# Patient Record
Sex: Male | Born: 1985 | Race: Black or African American | Hispanic: No | Marital: Married | Smoking: Current every day smoker
Health system: Southern US, Community
[De-identification: ages and names within clinical notes are randomized; demographics above are authoritative.]

## PROBLEM LIST (undated history)

## (undated) DIAGNOSIS — F323 Major depressive disorder, single episode, severe with psychotic features: Secondary | ICD-10-CM

---

## 2013-03-26 ENCOUNTER — Emergency Department (HOSPITAL_COMMUNITY)
Admission: EM | Admit: 2013-03-26 | Discharge: 2013-03-26 | Disposition: A | Discharge status: Home / Self Care | Payer: Self-pay | Attending: Emergency Medicine | Admitting: Emergency Medicine

## 2013-03-26 ENCOUNTER — Emergency Department (HOSPITAL_COMMUNITY): Payer: Self-pay

## 2013-03-26 ENCOUNTER — Encounter (HOSPITAL_COMMUNITY): Payer: Self-pay | Admitting: Emergency Medicine

## 2013-03-26 DIAGNOSIS — F17200 Nicotine dependence, unspecified, uncomplicated: Secondary | ICD-10-CM | POA: Insufficient documentation

## 2013-03-26 DIAGNOSIS — R059 Cough, unspecified: Secondary | ICD-10-CM | POA: Insufficient documentation

## 2013-03-26 DIAGNOSIS — R05 Cough: Secondary | ICD-10-CM | POA: Insufficient documentation

## 2013-03-26 DIAGNOSIS — J069 Acute upper respiratory infection, unspecified: Secondary | ICD-10-CM | POA: Insufficient documentation

## 2013-03-26 DIAGNOSIS — J029 Acute pharyngitis, unspecified: Secondary | ICD-10-CM | POA: Insufficient documentation

## 2013-03-26 LAB — RAPID STREP SCREEN (MED CTR MEBANE ONLY): Streptococcus, Group A Screen (Direct): NEGATIVE

## 2013-03-26 NOTE — ED Notes (Signed)
Patient transported to X-ray 

## 2013-03-26 NOTE — ED Provider Notes (Signed)
History     CSN: [id number]  Arrival date & time 03/26/13  0302   First MD Initiated Contact with Patient 03/26/13 0340      Chief Complaint  Patient presents with  . Nasal Congestion     HPI Pt was seen at 0400.  Per pt, c/o gradual onset and persistence of constant sore throat, runny/stuffy nose, sinus congestion, and cough for the past 2-3 days. States he has been taking OTC mucinex without relief.  Denies fevers, no rash, no CP/SOB, no N/V/D, no abd pain.     History reviewed. No pertinent past medical history.  History reviewed. No pertinent past surgical history.    History  Substance Use Topics  . Smoking status: Current Every Day Smoker  . Smokeless tobacco: Not on file  . Alcohol Use: Yes      Review of Systems ROS: Statement: All systems negative except as marked or noted in the HPI; Constitutional: Negative for fever and +chills. ; ; Eyes: Negative for eye pain, redness and discharge. ; ; ENMT: Negative for ear pain, hoarseness, +nasal congestion, sinus pressure and sore throat. ; ; Cardiovascular: Negative for chest pain, palpitations, diaphoresis, dyspnea and peripheral edema. ; ; Respiratory: +cough. Negative for wheezing and stridor. ; ; Gastrointestinal: Negative for nausea, vomiting, diarrhea, abdominal pain, blood in stool, hematemesis, jaundice and rectal bleeding. . ; ; Genitourinary: Negative for dysuria, flank pain and hematuria. ; ; Musculoskeletal: Negative for back pain and neck pain. Negative for swelling and trauma.; ; Skin: Negative for pruritus, rash, abrasions, blisters, bruising and skin lesion.; ; Neuro: Negative for headache, lightheadedness and neck stiffness. Negative for weakness, altered level of consciousness , altered mental status, extremity weakness, paresthesias, involuntary movement, seizure and syncope.       Allergies  Review of patient's allergies indicates no known allergies.  Home Medications  No current outpatient  prescriptions on file.  BP 126/83  Pulse 120  Temp(Src) 98.9 F (37.2 C) (Oral)  Resp 20  SpO2 99%  Physical Exam 0405: Physical examination:  Nursing notes reviewed; Vital signs and O2 SAT reviewed;  Constitutional: Well developed, Well nourished, Well hydrated, In no acute distress; Head:  Normocephalic, atraumatic; Eyes: EOMI, PERRL, No scleral icterus; ENMT: TM's clear bilat. +edemetous nasal turbinates bilat with clear rhinorrhea.  Mouth and pharynx normal, Mucous membranes moist; Neck: Supple, Full range of motion, No lymphadenopathy; Cardiovascular: Regular rate and rhythm, No murmur, rub, or gallop; Respiratory: Breath sounds clear & equal bilaterally, No rales, rhonchi, wheezes.  Speaking full sentences with ease, Normal respiratory effort/excursion; Chest: Nontender, Movement normal; Abdomen: Soft, Nontender, Nondistended, Normal bowel sounds; Genitourinary: No CVA tenderness; Extremities: Pulses normal, No tenderness, No edema, No calf edema or asymmetry.; Neuro: AA&Ox3, Major CN grossly intact.  Speech clear. Climbs on and off stretcher by himself easily. Gait steady. No gross focal motor or sensory deficits in extremities.; Skin: Color normal, Warm, Dry.   ED Course  Procedures     MDM  MDM Reviewed: previous chart, nursing note and vitals Interpretation: x-ray and labs   Results for orders placed during the hospital encounter of 03/26/13  RAPID STREP SCREEN      Result Value Range   Streptococcus, Group A Screen (Direct) NEGATIVE  NEGATIVE   Dg Chest 2 View 03/26/2013  *RADIOLOGY REPORT*  Clinical Data: Cough and congestion.  CHEST - 2 VIEW  Comparison: None.  Findings: The lungs are clear.  Heart size is normal.  No pneumothorax or pleural fluid.  No focal bony abnormality.  IMPRESSION: Negative chest.   Original Report Authenticated By: Holley Dexter, M.D.     [phone]:  No acute findings on workup, will tx symptomatically at this time. Wants to go home now. Dx and  testing d/w pt.  Questions answered.  Verb understanding, agreeable to d/c home with outpt f/u.          Laray Anger, DO 03/28/13 1500

## 2013-03-26 NOTE — ED Notes (Signed)
Pt presents with left sided head congestion and a cough for the past 2 days.  Pt has productive cough described as yellow in color.  Tried Mucinex at home without relief.

## 2015-05-16 ENCOUNTER — Emergency Department (HOSPITAL_COMMUNITY): Payer: Self-pay

## 2015-05-16 ENCOUNTER — Encounter (HOSPITAL_COMMUNITY): Payer: Self-pay | Admitting: Emergency Medicine

## 2015-05-16 ENCOUNTER — Emergency Department (HOSPITAL_COMMUNITY)
Admission: EM | Admit: 2015-05-16 | Discharge: 2015-05-17 | Disposition: A | Discharge status: Other Acute Inpatient Hospital | Payer: Federal, State, Local not specified - Other | Attending: Emergency Medicine | Admitting: Emergency Medicine

## 2015-05-16 DIAGNOSIS — G479 Sleep disorder, unspecified: Secondary | ICD-10-CM | POA: Insufficient documentation

## 2015-05-16 DIAGNOSIS — R0602 Shortness of breath: Secondary | ICD-10-CM | POA: Insufficient documentation

## 2015-05-16 DIAGNOSIS — R002 Palpitations: Secondary | ICD-10-CM | POA: Insufficient documentation

## 2015-05-16 DIAGNOSIS — R440 Auditory hallucinations: Secondary | ICD-10-CM | POA: Insufficient documentation

## 2015-05-16 DIAGNOSIS — Z720 Tobacco use: Secondary | ICD-10-CM | POA: Insufficient documentation

## 2015-05-16 DIAGNOSIS — R51 Headache: Secondary | ICD-10-CM | POA: Insufficient documentation

## 2015-05-16 DIAGNOSIS — F29 Unspecified psychosis not due to a substance or known physiological condition: Secondary | ICD-10-CM | POA: Diagnosis present

## 2015-05-16 LAB — CBC
HCT: 40.2 % (ref 39.0–52.0)
Hemoglobin: 13.4 g/dL (ref 13.0–17.0)
MCH: 26.3 pg (ref 26.0–34.0)
MCHC: 33.3 g/dL (ref 30.0–36.0)
MCV: 79 fL (ref 78.0–100.0)
PLATELETS: 288 10*3/uL (ref 150–400)
RBC: 5.09 MIL/uL (ref 4.22–5.81)
RDW: 13.6 % (ref 11.5–15.5)
WBC: 9.1 10*3/uL (ref 4.0–10.5)

## 2015-05-16 LAB — I-STAT TROPONIN, ED: Troponin i, poc: 0 ng/mL (ref 0.00–0.08)

## 2015-05-16 LAB — BASIC METABOLIC PANEL
ANION GAP: 10 (ref 5–15)
BUN: 6 mg/dL (ref 6–20)
CALCIUM: 9.3 mg/dL (ref 8.9–10.3)
CHLORIDE: 103 mmol/L (ref 101–111)
CO2: 26 mmol/L (ref 22–32)
Creatinine, Ser: 1.07 mg/dL (ref 0.61–1.24)
GFR calc non Af Amer: >60 mL/min (ref >60)
Glucose, Bld: 124 mg/dL — ABNORMAL HIGH (ref 65–99)
Potassium: 3.6 mmol/L (ref 3.5–5.1)
SODIUM: 139 mmol/L (ref 135–145)

## 2015-05-16 LAB — BRAIN NATRIURETIC PEPTIDE: B Natriuretic Peptide: 6.2 pg/mL (ref 0.0–100.0)

## 2015-05-16 MED ORDER — SODIUM CHLORIDE 0.9 % IV SOLN
1000.0000 mL | Freq: Once | INTRAVENOUS | Status: AC
Start: 2015-05-16 — End: 2015-05-17
  Administered 2015-05-16: 1000 mL via INTRAVENOUS

## 2015-05-16 MED ORDER — SODIUM CHLORIDE 0.9 % IV SOLN
1000.0000 mL | INTRAVENOUS | Status: DC
  Administered 2015-05-17: 1000 mL via INTRAVENOUS

## 2015-05-16 MED ORDER — METOCLOPRAMIDE HCL 5 MG/ML IJ SOLN
10.0000 mg | Freq: Once | INTRAMUSCULAR | Status: AC
  Administered 2015-05-16: 10 mg via INTRAVENOUS
  Filled 2015-05-16: qty 2

## 2015-05-16 MED ORDER — DIPHENHYDRAMINE HCL 50 MG/ML IJ SOLN
25.0000 mg | Freq: Once | INTRAMUSCULAR | Status: AC
  Administered 2015-05-16: 25 mg via INTRAVENOUS
  Filled 2015-05-16: qty 1

## 2015-05-16 NOTE — ED Notes (Addendum)
Pt. reports intermittent central chest pain with mild SOB, palpitations  and occasional dry cough onset today , denies nausea or diaphoresis . No chest pain at arrival.

## 2015-05-16 NOTE — ED Notes (Signed)
Patient states he has had CP x1 year. Patient states, " I feel like my heart is stopping".

## 2015-05-16 NOTE — ED Provider Notes (Signed)
CSN: [id number]     Arrival date & time 05/16/15  2031 History  This chart was scribed for Dione Boozeavid Liesel Peckenpaugh, MD by Tanda RockersMargaux Venter, ED Scribe. This patient was seen in room B18C/B18C and the patient's care was started at 11:12 PM.  Chief Complaint  Patient presents with  . Chest Pain   The history is provided by the patient and the spouse. No language interpreter was used.     HPI Comments: Gregory Graham is a 29 y.o. male with no pertinent past medical history who presents to the Emergency Department complaining of intermittent, diffuse headache x 8 months, constant for the past few days. He rates the pain as a 10/10 on the pain scale. No modifying factors to the headache. He states that he came to the ED today because he could not handle the pain anymore. Wife mentions that pt does not like taking medication so he has not tried much for the headache. She notes that pt has also not been able to sleep for the last 3 days. Pt has never had difficulty falling asleep in the past. He denies feelings of depression, SI, HI. He states that he would like to hurt his wife but is not planning on killing her. Pt admits to auditory hallucinations x 1 year. He states that voices have been telling him to "slap his wife."  Wife mentions that pt has been having crying spells as well and has had one while in the ED tonight. Pt does mention feeling like he cannot get out of bed some mornings. He has never seen a psychiatrist for these symptoms. Denies positive fhx for psychiatric illness. Pt also complains of intermittent palpitations x 1 month. He notes chest pain that he describes as a pressure. The pain is exacerbated with movement. Pt also complains of difficulty "breathing through his nose." He attributes these symptoms to him smoking 1 ppd. Denies diaphoresis, nausea, vomiting, or any other symptoms. No EtOH or illicit drug use.   History reviewed. No pertinent past medical history. History reviewed. No pertinent past  surgical history. No family history on file. History  Substance Use Topics  . Smoking status: Current Every Day Smoker  . Smokeless tobacco: Not on file  . Alcohol Use: Yes    Review of Systems  Constitutional: Negative for diaphoresis.  Respiratory: Positive for shortness of breath.   Cardiovascular: Positive for chest pain and palpitations.  Gastrointestinal: Negative for nausea and vomiting.  Neurological: Positive for headaches.  Psychiatric/Behavioral: Positive for hallucinations and sleep disturbance. Negative for suicidal ideas.  All other systems reviewed and are negative.     Allergies  Review of patient's allergies indicates no known allergies.  Home Medications   Prior to Admission medications   Medication Sig Start Date End Date Taking? Authorizing Provider  guaiFENesin (ROBITUSSIN) 100 MG/5ML liquid Take 200 mg by mouth 3 (three) times daily as needed for cough.    Historical Provider, MD   Triage Vitals: BP 132/86 mmHg  Pulse 96  Temp(Src) 98.6 F (37 C) (Oral)  Resp 18  Ht 5\' 9"  (1.753 m)  Wt 148 lb (67.132 kg)  BMI 21.85 kg/m2  SpO2 100%   Physical Exam  Constitutional: He is oriented to person, place, and time. He appears well-developed and well-nourished. No distress.  HENT:  Head: Normocephalic and atraumatic.  Eyes: Conjunctivae and EOM are normal. Pupils are equal, round, and reactive to light.  Fundi are normal.   Neck: Normal range of motion. Neck supple. No  JVD present.  Cardiovascular: Normal rate, regular rhythm and normal heart sounds.   No murmur heard. Pulmonary/Chest: Effort normal and breath sounds normal. He has no wheezes. He has no rales. He exhibits no tenderness.  Abdominal: Soft. Bowel sounds are normal. He exhibits no distension and no mass. There is no tenderness.  Musculoskeletal: Normal range of motion. He exhibits no edema.  Lymphadenopathy:    He has no cervical adenopathy.  Neurological: He is alert and oriented to  person, place, and time. No cranial nerve deficit. He exhibits normal muscle tone. Coordination normal.  Skin: Skin is warm and dry.  Psychiatric:  Depressed affect. Speech is slow, thought processes appear slow.  Makes poor eye contact.  Speaks very softly.   Nursing note and vitals reviewed.   ED Course  Procedures (including critical care time)  DIAGNOSTIC STUDIES: Oxygen Saturation is 100% on RA, normal by my interpretation.    COORDINATION OF CARE: 11:22 PM-Discussed treatment plan which includes consult TTS, CBC, BMP, BNP, Troponin with pt at bedside and pt agreed to plan.   Labs Review Results for orders placed or performed during the hospital encounter of 05/16/15  CBC  Result Value Ref Range   WBC 9.1 4.0 - 10.5 K/uL   RBC 5.09 4.22 - 5.81 MIL/uL   Hemoglobin 13.4 13.0 - 17.0 g/dL   HCT 13.0 86.5 - 78.4 %   MCV 79.0 78.0 - 100.0 fL   MCH 26.3 26.0 - 34.0 pg   MCHC 33.3 30.0 - 36.0 g/dL   RDW 69.6 29.5 - 28.4 %   Platelets 288 150 - 400 K/uL  Basic metabolic panel  Result Value Ref Range   Sodium 139 135 - 145 mmol/L   Potassium 3.6 3.5 - 5.1 mmol/L   Chloride 103 101 - 111 mmol/L   CO2 26 22 - 32 mmol/L   Glucose, Bld 124 (H) 65 - 99 mg/dL   BUN 6 6 - 20 mg/dL   Creatinine, Ser 1.32 0.61 - 1.24 mg/dL   Calcium 9.3 8.9 - 44.0 mg/dL   GFR calc non Af Amer >60 >60 mL/min   GFR calc Af Amer >60 >60 mL/min   Anion gap 10 5 - 15  BNP (order ONLY if patient complains of dyspnea/SOB AND you have documented it for THIS visit)  Result Value Ref Range   B Natriuretic Peptide 6.2 0.0 - 100.0 pg/mL  Hepatic function panel  Result Value Ref Range   Total Protein 8.2 (H) 6.5 - 8.1 g/dL   Albumin 4.5 3.5 - 5.0 g/dL   AST 17 15 - 41 U/L   ALT 12 (L) 17 - 63 U/L   Alkaline Phosphatase 51 38 - 126 U/L   Total Bilirubin 0.5 0.3 - 1.2 mg/dL   Bilirubin, Direct 0.1 0.1 - 0.5 mg/dL   Indirect Bilirubin 0.4 0.3 - 0.9 mg/dL  Ethanol  Result Value Ref Range   Alcohol, Ethyl  (B) <5 <5 mg/dL  I-stat troponin, ED  (not at [facility] [place])  Result Value Ref Range   Troponin i, poc 0.00 0.00 - 0.08 ng/mL   Comment 3            Imaging Review Dg Chest 2 View  05/16/2015   CLINICAL DATA:  29 year old male with chest pain.  EXAM: CHEST  2 VIEW  COMPARISON:  Chest radiograph dated 03/26/2013  FINDINGS: The heart size and mediastinal contours are within normal limits. Both lungs are clear. The visualized skeletal structures are  unremarkable.  IMPRESSION: No active cardiopulmonary disease.   Electronically Signed   By: Elgie Collard M.D.   On: 05/16/2015 21:12     EKG Interpretation   Date/Time:  Wednesday May 16 2015 20:40:13 EDT Ventricular Rate:  121 PR Interval:  132 QRS Duration: 80 QT Interval:  318 QTC Calculation: 451 R Axis:   83 Text Interpretation:  Sinus tachycardia Right atrial enlargement  Nonspecific T wave abnormality Abnormal ECG No old tracing to compare  Confirmed by [facility]  MD, Jacorian Golaszewski ([id number]) on 05/16/2015 11:07:38 PM      MDM   Final diagnoses:  Auditory hallucinations    Patient presented with varied complaints. However, after extensive questioning, it became clear that his main problem is psychiatric. He has been having auditory hallucinations for a year and apparently has had thoughts of hurting his spouse. He states that his auditory hallucinations are telling him to hurt his spouse. He denies actual homicidal ideation and denies suicidal ideation. Insomnia I believe is related to his psychiatric problem. Headache appears to be benign and he will be given a headache cocktail of normal saline, metoclopramide, diphenhydramine. Heart palpitations appear benign. ECG is unremarkable. Cardiac monitor shows sinus rhythm and I have not seen any PVCs or PACs, but that is what his symptoms sound like. Screening labs will be obtained and consultation will be obtained with TTS.   TTS consult appreciated. Patient with command hallucinations. His mother  arrived and 1 and him to go home. However, with command hallucinations and thoughts of hurting his wife, was not felt safe. Patient and his mother were advised that if the to try to leave, he would be placed under involuntary commitment. They are agreeing to stay voluntarily. TTS is requested formal psychiatric consultation in the morning.  I personally performed the services described in this documentation, which was scribed in my presence. The recorded information has been reviewed and is accurate.       Dione Booze, MD 05/17/15 [phone]

## 2015-05-17 ENCOUNTER — Inpatient Hospital Stay (HOSPITAL_COMMUNITY)
Admission: AD | Admit: 2015-05-17 | Discharge: 2015-05-22 | DRG: 885 | Disposition: A | Discharge status: Home / Self Care | Payer: Federal, State, Local not specified - Other | Attending: Psychiatry | Admitting: Psychiatry

## 2015-05-17 ENCOUNTER — Encounter (HOSPITAL_COMMUNITY): Payer: Self-pay | Admitting: *Deleted

## 2015-05-17 DIAGNOSIS — F17200 Nicotine dependence, unspecified, uncomplicated: Secondary | ICD-10-CM | POA: Diagnosis present

## 2015-05-17 DIAGNOSIS — G4700 Insomnia, unspecified: Secondary | ICD-10-CM | POA: Diagnosis present

## 2015-05-17 DIAGNOSIS — F29 Unspecified psychosis not due to a substance or known physiological condition: Secondary | ICD-10-CM | POA: Diagnosis present

## 2015-05-17 DIAGNOSIS — F1290 Cannabis use, unspecified, uncomplicated: Secondary | ICD-10-CM | POA: Diagnosis not present

## 2015-05-17 DIAGNOSIS — R440 Auditory hallucinations: Secondary | ICD-10-CM | POA: Insufficient documentation

## 2015-05-17 DIAGNOSIS — F322 Major depressive disorder, single episode, severe without psychotic features: Secondary | ICD-10-CM | POA: Diagnosis not present

## 2015-05-17 DIAGNOSIS — Z720 Tobacco use: Secondary | ICD-10-CM | POA: Diagnosis not present

## 2015-05-17 DIAGNOSIS — F1221 Cannabis dependence, in remission: Secondary | ICD-10-CM | POA: Diagnosis present

## 2015-05-17 DIAGNOSIS — F323 Major depressive disorder, single episode, severe with psychotic features: Secondary | ICD-10-CM | POA: Diagnosis not present

## 2015-05-17 DIAGNOSIS — R45850 Homicidal ideations: Secondary | ICD-10-CM

## 2015-05-17 DIAGNOSIS — R45851 Suicidal ideations: Secondary | ICD-10-CM

## 2015-05-17 DIAGNOSIS — F17210 Nicotine dependence, cigarettes, uncomplicated: Secondary | ICD-10-CM | POA: Diagnosis present

## 2015-05-17 DIAGNOSIS — F419 Anxiety disorder, unspecified: Secondary | ICD-10-CM | POA: Diagnosis present

## 2015-05-17 HISTORY — DX: Major depressive disorder, single episode, severe with psychotic features: F32.3

## 2015-05-17 LAB — HEPATIC FUNCTION PANEL
ALBUMIN: 4.5 g/dL (ref 3.5–5.0)
ALT: 12 U/L — AB (ref 17–63)
AST: 17 U/L (ref 15–41)
Alkaline Phosphatase: 51 U/L (ref 38–126)
BILIRUBIN DIRECT: 0.1 mg/dL (ref 0.1–0.5)
BILIRUBIN INDIRECT: 0.4 mg/dL (ref 0.3–0.9)
Total Bilirubin: 0.5 mg/dL (ref 0.3–1.2)
Total Protein: 8.2 g/dL — ABNORMAL HIGH (ref 6.5–8.1)

## 2015-05-17 LAB — RAPID URINE DRUG SCREEN, HOSP PERFORMED
Amphetamines: NOT DETECTED
BARBITURATES: NOT DETECTED
BENZODIAZEPINES: NOT DETECTED
COCAINE: NOT DETECTED
OPIATES: NOT DETECTED
Tetrahydrocannabinol: NOT DETECTED

## 2015-05-17 LAB — ETHANOL

## 2015-05-17 MED ORDER — MAGNESIUM HYDROXIDE 400 MG/5ML PO SUSP: 30.0000 mL | Freq: Every day | ORAL | Status: DC | PRN

## 2015-05-17 MED ORDER — ZOLPIDEM TARTRATE 5 MG PO TABS: 5.0000 mg | ORAL_TABLET | Freq: Every evening | ORAL | Status: DC | PRN

## 2015-05-17 MED ORDER — NICOTINE 14 MG/24HR TD PT24
14.0000 mg | MEDICATED_PATCH | Freq: Every day | TRANSDERMAL | Status: DC
  Filled 2015-05-17: qty 1

## 2015-05-17 MED ORDER — ALUM & MAG HYDROXIDE-SIMETH 200-200-20 MG/5ML PO SUSP: 30.0000 mL | ORAL | Status: DC | PRN

## 2015-05-17 MED ORDER — IBUPROFEN 400 MG PO TABS: 600.0000 mg | ORAL_TABLET | Freq: Three times a day (TID) | ORAL | Status: DC | PRN

## 2015-05-17 MED ORDER — TRAZODONE HCL 50 MG PO TABS: 50.0000 mg | ORAL_TABLET | Freq: Every evening | ORAL | Status: DC | PRN

## 2015-05-17 MED ORDER — ONDANSETRON HCL 4 MG PO TABS: 4.0000 mg | ORAL_TABLET | Freq: Three times a day (TID) | ORAL | Status: DC | PRN

## 2015-05-17 MED ORDER — LORAZEPAM 1 MG PO TABS: 1.0000 mg | ORAL_TABLET | Freq: Three times a day (TID) | ORAL | Status: DC | PRN

## 2015-05-17 MED ORDER — ACETAMINOPHEN 325 MG PO TABS: 650.0000 mg | ORAL_TABLET | ORAL | Status: DC | PRN

## 2015-05-17 NOTE — ED Notes (Signed)
Lunch served

## 2015-05-17 NOTE — Tx Team (Signed)
Initial Interdisciplinary Treatment Plan   PATIENT STRESSORS: Health problems AH  Family stressors   PATIENT STRENGTHS: Capable of independent living Communication skills General fund of knowledge Supportive family/friends Work skills   PROBLEM LIST: Problem List/Patient Goals Date to be addressed Date deferred Reason deferred Estimated date of resolution  "my head, my heart" 05-17-15     "my head throbbing, lot of aches, pain" 05-17-15     "can't focus,voices repeating, over & over" 05-17-15     depression 05-17-15     anxiety 05-17-15                              DISCHARGE CRITERIA:  Improved stabilization in mood, thinking, and/or behavior Need for constant or close observation no longer present Reduction of life-threatening or endangering symptoms to within safe limits  PRELIMINARY DISCHARGE PLAN: Attend PHP/IOP Participate in family therapy Return to previous living arrangement Return to previous work or school arrangements  PATIENT/FAMIILY INVOLVEMENT: This treatment plan has been presented to and reviewed with the patient, Gregory Graham, and/or family member.  The patient and family have been given the opportunity to ask questions and make suggestions.  Mickeal NeedyJohnson, [name] N 05/17/2015, 8:57 PM

## 2015-05-17 NOTE — ED Provider Notes (Signed)
Psychiatry seen patient.  They're recommending inpatient treatment.  Patient has reported to be having hallucinations, hearing voices telling him to hurt and kill people, specifically his wife.  IVC has been filed by myself.   1. Auditory hallucinations      Toy CookeyMegan Docherty, MD 05/17/15 [phone]

## 2015-05-17 NOTE — ED Notes (Signed)
Psychiatrist at bedside

## 2015-05-17 NOTE — BH Assessment (Signed)
Explained recommendations to pt and family. Pt reported he did not really understand what he had agreed to previously and now wanted to go home. Discussed with pt the reasons he was being asked to stay, noting the need for UA to be collected and resulted, and the benefits of being seen by psychiatry later in the morning. Pt reports "I think I will be fine." Explained to pt and family that pt's sx were severe enough to warrant petition for IVC if decided he would not stay. Discussed voluntary vs. Involuntary. Pt voiced understanding and agreed to stay. Family expressed concerns about not being able to stay with pt in ED. Explained that if that was the policy it would have to be followed, but that they could call to check on pt with nurses if pt gave consent. Explained there were set hours for phone and visitation and that the nurses could inform them of that protocol. Mom and wife expressed understanding. Stressed with pt and family pt would not automatically come home tomorrow after seeing psychiatrist but that a recommendation would be given based on his medical status, and symptoms, and together with his treatment team a plan would be developed. Pt and family voiced understanding. Mother asked what kind of medications pt would be given tonight. Informed her this would be determined by the doctor and pt would be able to ask questions and share any concerns he had about medication. Instructed pt this writer would be available until 0730 if he had any additional questions or needed more clarification.    Clista BernhardtNancy Chara Marquard, [facility] Triage Specialist 05/17/2015 3:40 AM

## 2015-05-17 NOTE — BH Assessment (Addendum)
Tele Assessment Note  UDS had not resulted at the time of this assessment. Pt denies any recent use of drugs or alcohol.   Gregory Graham is an 29 y.o. male presenting to ED with complaints of headache, chest pain and trouble sleeping. During the course of his evaluation with EDP he reported mood lability and AH with command and a TTS consult was ordered.   At the time of assessment pt was calm and cooperative. He was oriented to person, place, and situation, but relied on wife with help on month and current date. Pt reports depressed and anxious mood, and displays a flat affect. Speech is logical, coherent, soft, and slow. Insight and judgment seem partial to fair. Pt denies self-Gregory, SI, or current SA. He reports hearing voices outside, nearly all the time, telling him to hurt or kill. Pt was vague about this. He denied intent to act on these voices, or intended victim. Pt did however, tell EDP that voices make comments about him hurting his wife. Pt reports he started to feel depressed about a year ago, then began to have intense intermittent headaches, followed by onset of AH. Pt reports he had been using THC since age [age] but stopped about 3-5 months ago due to increased paranoia. He reports he stopped THC prior to onset of HA and AH but then said he stopped due to headaches and paranoia. Pt reports he began drinking alcohol about two years ago, and drinks about 1 shot, maybe monthly.   Pt reports there were no specific events or stressors evident prior to his onset of depression. He reports current stressors include, "normal life stuff" and "work." pt reports his headaches and chest pain have effected his attendance at work. Wife reports pt has not been acting himself the past couple of days. She reports he is always quiet but, has been isolating, withdrawn, and even more quiet. She reports he has not been eating or sleeping, appears to have trouble concentrating, is not attentive to her when she  speaks to him, and has been smoking cigarettes very heavily.   Pt reports no past hx of mental health treatment. He reports he began to experience depressive sx about a year ago. He reports symptoms have been increasing in intensity. Pt reports feeling sad, lonely, isolating, anhedonia, loss of motivation, trouble initiating and maintaining sleep with early morning awakenings, and irritability. He denies sx of mania or hypomania. He reports infrequently he will feel happy for an hour to two. He denies self-injury or suicidal ideation, also denies hx of the same.   Pt reports has always had some problems sitting still and feeling fidgety, but reports he feels like his sx of anxiety have been getting worse as well. He reports "It is hard to sit still, I fidget." He reports he worries about life stressors, but feels the worry is out of proportion to the stressors. Pt reports infrequent panic attacks noting the last one was a month ago. Pt may be having trouble identifying panic attacks, as he came to ED with CP, and trouble breathing, which may have been related to his anxiety. Pt reports no hx of abuse or neglect. He denies experiencing any traumatic events. Denies sx of PTSD, or phobias. Pt reports mild rechecking of locks, and checking on children. He reports he has been having a great deal of trouble with concentration.   Pt denies family hx of mental health concerns, SA, or suicide. He reports he is willing to remain in  ED for observation and to be seen by psychiatry or to be admitted to inpt care if it is recommended.    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 No pertinent past medical history.  History reviewed. No pertinent past surgical history.  Family History: No family  history on file.  Social History:  reports that he has been smoking.  He does not have any smokeless tobacco history on file. He reports that he drinks alcohol. He reports that he does not use illicit drugs.  Additional Social History:  Alcohol / Drug Use Pain Medications: Denies Prescriptions: See PTA, denies any prescriptions Over the Counter: See PTA, reports recently tried children's nyguil for his headache  History of alcohol / drug use?: Yes Longest period of sobriety (when/how long): 3-5 months for THC  Negative Consequences of Use:  (denies) Withdrawal Symptoms:  (denies) Substance #1 Name of Substance 1: etoh  1 - Age of First Use: 26 1 - Amount (size/oz): 1 shot 1 - Frequency: about once a month  1 - Duration: two years 1 - Last Use / Amount: unknown Substance #2 Name of Substance 2: THC 2 - Age of First Use: [age]  2 - Amount (size/oz): uncertain  2 - Frequency: "quite often" nearly every day  2 - Duration: about 9 years  2 - Last Use / Amount: reports stopped 3-5 months ago because he was feeling paranoid  CIWA: CIWA-Ar BP: 109/70 mmHg Pulse Rate: 90 COWS:    PATIENT STRENGTHS: (choose at least two) Communication skills Supportive family/friends Work skills  Allergies: No Known Allergies  Home Medications:  (Not in a hospital admission)  OB/GYN Status:  No LMP for male patient.  General Assessment Data Location of Assessment: [facility] ED TTS Assessment: In system Is this a Tele or Face-to-Face Assessment?: Tele Assessment Is this an Initial Assessment or a Re-assessment for this encounter?: Initial Assessment Marital status: Married Is patient pregnant?: No Pregnancy Status: No Living Arrangements: Spouse/significant other, Children (wife and two children age 6, 71) Can pt return to current living arrangement?: Yes Admission Status: Voluntary Is patient capable of signing voluntary admission?: Yes Referral Source: Self/Family/Friend Insurance type: SP      Crisis Care Plan Living Arrangements: Spouse/significant other, Children (wife and two children age 35, 89) Name of Psychiatrist: none Name of Therapist: none  Education Status Is patient currently in school?: No Current Grade: NA Highest grade of school patient has completed: 12 Name of school: NA Contact person: NA  Risk to self with the past 6 months Suicidal Ideation: No Has patient been a risk to self within the past 6 months prior to admission? : No Suicidal Intent: No Has patient had any suicidal intent within the past 6 months prior to admission? : No Is patient at risk for suicide?: No Suicidal Plan?: No Has patient had any suicidal plan within the past 6 months prior to admission? : No Access to Means: No What has been your use of drugs/alcohol within the last 12 months?: Pt reports drinking a shot about once a month for the past two years. Reports he was smoking THC from age 62 or 89 until about 3-5 months ago. He reports he stopped due to increase in paranoia  Previous Attempts/Gestures: No How many times?: 0 Other Self  Gregory Risks: none Triggers for Past Attempts: None known Intentional Self Injurious Behavior: None Family Suicide History: No Recent stressful life event(s): Other (Comment) ("normal life things" work ) Persecutory voices/beliefs?: No Depression: Yes Depression Symptoms: Despondent, Insomnia, Tearfulness, Isolating, Loss of interest in usual pleasures, Feeling worthless/self pity, Feeling angry/irritable Substance abuse history and/or treatment for substance abuse?: No Suicide prevention information given to non-admitted patients: Yes  Risk to Others within the past 6 months Homicidal Ideation: Yes-Currently Present Does patient have any lifetime risk of violence toward others beyond the six months prior to admission? : No Thoughts of Gregory to Others: Yes-Currently Present Comment - Thoughts of Gregory to Others: Pt reports he hears voices outside  telling him to hurt or  kill, he was vague, but previously reported to EDP he had command AH to slap his wife Current Homicidal Intent: No Current Homicidal Plan: No Access to Homicidal Means: No Identified Victim: possibly his wife, denies named victim to this Clinical research associatewriter, but noted wife to EDP  History of Gregory to others?: No Assessment of Violence: None Noted Violent Behavior Description: none Does patient have access to weapons?: No Criminal Charges Pending?: No Does patient have a court date: No Is patient on probation?: No  Psychosis Hallucinations: Auditory, With command Delusions: None noted  Mental Status Report Appearance/Hygiene: Unremarkable Eye Contact: Good Motor Activity: Unremarkable Speech: Logical/coherent, Slow, Soft Level of Consciousness: Alert Mood: Depressed, Anxious Affect: Flat Anxiety Level: Panic Attacks Panic attack frequency: "not that often" Most recent panic attack: "about a month ago" possible pt was having one earlier in night with reported CP and trouble breathing  Thought Processes: Coherent, Relevant Judgement: Partial Orientation: Person, Place, Time, Situation Obsessive Compulsive Thoughts/Behaviors: None  Cognitive Functioning Concentration: Decreased Memory: Recent Intact, Remote Intact IQ: Average Insight: Poor Impulse Control: Fair Appetite: Poor Weight Loss: 0 (reports not eating for the past two days ) Weight Gain: 0 Sleep: Decreased Total Hours of Sleep: 4 (4-5 problems for "Awhile") Vegetative Symptoms: Decreased grooming  ADLScreening [facility] Assessment Services) Patient's cognitive ability adequate to safely complete daily activities?: Yes Patient able to express need for assistance with ADLs?: Yes Independently performs ADLs?: Yes (appropriate for developmental age)  Prior Inpatient Therapy Prior Inpatient Therapy: No Prior Therapy Dates: NA Prior Therapy Facilty/Provider(s): NA Reason for Treatment: NA  Prior Outpatient  Therapy Prior Outpatient Therapy: No Prior Therapy Dates: NA Prior Therapy Facilty/Provider(s): NA Reason for Treatment: NA Does patient have an ACCT team?: No Does patient have Intensive In-House Services?  : No Does patient have [facility] services? : No Does patient have P4CC services?: No  ADL Screening (condition at time of admission) Patient's cognitive ability adequate to safely complete daily activities?: Yes Is the patient deaf or have difficulty hearing?: No Does the patient have difficulty seeing, even when wearing glasses/contacts?: No Does the patient have difficulty concentrating, remembering, or making decisions?: Yes Patient able to express need for assistance with ADLs?: Yes Does the patient have difficulty dressing or bathing?: No Independently performs ADLs?: Yes (appropriate for developmental age) Does the patient have difficulty walking or climbing stairs?: No Weakness of Legs: None Weakness of Arms/Hands: None  Home Assistive Devices/Equipment Home Assistive Devices/Equipment: None    Abuse/Neglect Assessment (Assessment to be complete while patient is alone) Physical Abuse: Denies Verbal Abuse: Denies Sexual Abuse: Denies Exploitation of patient/patient's resources: Denies Self-Neglect: Denies Values / Beliefs Cultural Requests During Hospitalization: None Spiritual Requests During Hospitalization: None   Advance Directives (For Healthcare) Does patient have an  advance directive?: No Would patient like information on creating an advanced directive?: No - patient declined information    Additional Information 1:1 In Past 12 Months?: No CIRT Risk: No Elopement Risk: No Does patient have medical clearance?: No (labs pending )     Disposition:  Per Donell Sievert, PA pt should have an AM psych evaluation once he is medically cleared.   Spoke with Dr. Bebe Shaggy who reports pt's mother has come into the room and wants to take pt home. Family does not  appear to appreciate the serious nature of pt's sx. Per Dr. Bebe Shaggy he will initiate IVC if pt refuses to stay.   Agreed to discuss recommendations with pt and family over monitor.     Clista Bernhardt, [facility] Triage Specialist 05/17/2015 3:04 AM  Disposition Initial Assessment Completed for this Encounter: Yes  Gregory Graham 05/17/2015 2:45 AM

## 2015-05-17 NOTE — ED Notes (Signed)
Patient belonging given to wife. Patient wife requested a work note for patient since he had to stay.

## 2015-05-17 NOTE — Progress Notes (Signed)
Per [facility] CSW, patient is now under IVC and is recommended for inpatient hospitalization for stabilization.  This CSW has referred patient out to the following hospitals and await response:  [facility]  Arshia Rondon, MSW, [place]  [phone]

## 2015-05-17 NOTE — ED Notes (Signed)
[employer] called for transport 

## 2015-05-17 NOTE — ED Notes (Signed)
MD made aware patient family request patient to go homme. Patient mother, states" I rather take him home a pray for him. He is not going to hurt anybody. I know his rights you cant make him stay".

## 2015-05-17 NOTE — Progress Notes (Addendum)
[facility] to Dr. Elna BreslowEappen, bed [phone], call report at [phone]. RN Wille CelesteJanie was informed.  [place] Abtsatia Oswell Say, LCSWA Disposition staff 05/17/2015 4:45 PM

## 2015-05-17 NOTE — ED Notes (Signed)
Pt family came to visit at 12:50pm.  RN gave pt visitor hours information and brochure after pt questioned visiting hours.  Pt family states they already received information and was told they could stay longer.  Family was upset stating that pt should get to come home because the Doctor lied and said Pt stated he wanted to kill his wife.  Pt family asked about his status and when explained that he was voluntary at this time, family told pt that he can just get up and come on home.  RN explained that if the MD deems it unsafe for pt to go home at this time, pt can become involuntarily committed.  Family stated that there were ways to get things done and they will be going over our heads.  Family left in an agitated mood.  RN talked with Child psychotherapistsocial worker and she is in process of filling out IVC paperwork.

## 2015-05-17 NOTE — ED Notes (Signed)
Pt family left, [employer] escorted off the unit.
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
## 2015-05-17 NOTE — Progress Notes (Signed)
Patient ID: Gregory HessChristopher Graham, male   DOB: 1986-10-01, 29 y.o.   MRN: [id number] Client is a 29 yo male admitted involuntarily with complaints of AH, "can't focus, voices repeating over and over" "my head throbbing, lot of aches and pain" "my head, my heart" Client denies SHI, endorses depression "6" of 10 and anxiety "5" of 10. Client clearly anxious during this assessment, fidgety, biting finger nails, and restless. Client report only sleeps 4-5 hours a night. Client report irregular heart beat and occasional seasonal sinus relate issues. Client notes that he drinks 1-2 beers a month. Client oriented to unit and room. Client called wife for clothes. Staff will monitor q8815min for safety. Client is safe on the unit.

## 2015-05-17 NOTE — ED Notes (Signed)
Pt family visiting, [facility], [employer] here for transport, giving pt mom a chance to say good bye and [facility].

## 2015-05-17 NOTE — ED Notes (Signed)
MD spoke with Family. MD requested this nurse [facility] and transfer to his phone. [facility]. States will call this nurse within 10-15 minutes.

## 2015-05-17 NOTE — ED Notes (Addendum)
[employer] served IVC paperwork to Pt.  Spoke with pt about treatment plan and pt verbalized understanding.  Pt stated he was ready to go home and this RN explained IVC guidelines.
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
## 2015-05-17 NOTE — ED Notes (Addendum)
Staffing office called to request safety sitter

## 2015-05-17 NOTE — ED Notes (Signed)
Patient eating cookout brought by patient. Patient advised he was able to eat until test done.

## 2015-05-18 ENCOUNTER — Encounter (HOSPITAL_COMMUNITY): Payer: Self-pay | Admitting: Psychiatry

## 2015-05-18 DIAGNOSIS — Z720 Tobacco use: Secondary | ICD-10-CM

## 2015-05-18 DIAGNOSIS — F17200 Nicotine dependence, unspecified, uncomplicated: Secondary | ICD-10-CM | POA: Diagnosis present

## 2015-05-18 DIAGNOSIS — F29 Unspecified psychosis not due to a substance or known physiological condition: Secondary | ICD-10-CM

## 2015-05-18 DIAGNOSIS — F1290 Cannabis use, unspecified, uncomplicated: Secondary | ICD-10-CM

## 2015-05-18 DIAGNOSIS — F1221 Cannabis dependence, in remission: Secondary | ICD-10-CM | POA: Diagnosis present

## 2015-05-18 DIAGNOSIS — F419 Anxiety disorder, unspecified: Secondary | ICD-10-CM

## 2015-05-18 MED ORDER — NICOTINE POLACRILEX 2 MG MT GUM
2.0000 mg | CHEWING_GUM | OROMUCOSAL | Status: DC | PRN
  Administered 2015-05-20: 2 mg via ORAL

## 2015-05-18 MED ORDER — HYDROXYZINE HCL 25 MG PO TABS: 25.0000 mg | ORAL_TABLET | Freq: Three times a day (TID) | ORAL | Status: DC | PRN

## 2015-05-18 MED ORDER — AMITRIPTYLINE HCL 25 MG PO TABS: 25.0000 mg | ORAL_TABLET | Freq: Every evening | ORAL | Status: DC | PRN

## 2015-05-18 MED ORDER — ACETAMINOPHEN 325 MG PO TABS
650.0000 mg | ORAL_TABLET | Freq: Four times a day (QID) | ORAL | Status: DC | PRN
  Administered 2015-05-19: 650 mg via ORAL
  Filled 2015-05-18: qty 2

## 2015-05-18 NOTE — [facility] Suicide Risk Assessment (Signed)
[facility]   Nursing information obtained from:  Patient Demographic factors:  Male Current Mental Status:  NA Loss Factors:  NA Historical Factors:  NA Risk Reduction Factors:  Responsible for children under 28 years of age, Sense of responsibility to family, Employed, Living with another person, especially a relative, Positive social support Total Time spent with patient: 30 minutes Principal Problem: Psychosis Diagnosis:   Patient Active Problem List   Diagnosis Date Noted  . Psychosis [F29] 05/18/2015  . Anxiety [F41.9] 05/18/2015     Continued Clinical Symptoms:  Alcohol Use Disorder Identification Test Final Score (AUDIT): 1 The "Alcohol Use Disorders Identification Test", Guidelines for Use in Primary Care, Second Edition.  World Science writerHealth Organization [facility](WHO). Score between 0-7:  no or low risk or alcohol related problems. Score between 8-15:  moderate risk of alcohol related problems. Score between 16-19:  high risk of alcohol related problems. Score 20 or above:  warrants further diagnostic evaluation for alcohol dependence and treatment.   CLINICAL FACTORS:   ANXIETY SX UNSPECIFIED   Musculoskeletal: Strength & Muscle Tone: within normal limits Gait & Station: normal Patient leans: N/A  Psychiatric Specialty Exam: Physical Exam  Review of Systems  Psychiatric/Behavioral: The patient is nervous/anxious.   All other systems reviewed and are negative.   Blood pressure 108/75, pulse 85, temperature 98.3 F (36.8 C), temperature source Oral, resp. rate 20, height 5\' 8"  (1.727 m), weight 71.668 kg (158 lb).Body mass index is 24.03 kg/(m^2).            Please see H&P.                                              COGNITIVE FEATURES THAT CONTRIBUTE TO RISK:  Closed-mindedness, Polarized thinking and Thought constriction (tunnel vision)    SUICIDE RISK:   Mild:  Suicidal ideation of limited frequency, intensity,  duration, and specificity.  There are no identifiable plans, no associated intent, mild dysphoria and related symptoms, good self-control (both objective and subjective assessment), few other risk factors, and identifiable protective factors, including available and accessible social support.  PLAN OF CARE: Please see H&P.   Medical Decision Making:  Review of Psycho-Social Stressors (1), Review or order clinical lab tests (1), Established Problem, Worsening (2), Review of Last Therapy Session (1), Review of Medication Regimen & Side Effects (2) and Review of New Medication or Change in Dosage (2)  I certify that inpatient services furnished can reasonably be expected to improve the patient's condition.   Hildegarde Dunaway md 05/18/2015, 12:42 PM

## 2015-05-18 NOTE — [facility] Group Notes (Signed)
[facility] Note   05/18/2015 10:28 AM  Participation Quality:  Invited.  Chose to not attend.    Daryel GeraldNorth, Lorell Thibodaux B

## 2015-05-18 NOTE — Progress Notes (Signed)
Psychoeducational Group Note  Date:  05/18/2015 Time:  2303  Group Topic/Focus:  Wrap-Up Group:   The focus of this group is to help patients review their daily goal of treatment and discuss progress on daily workbooks.  Participation Level: Did Not Attend  Participation Quality:  Not Applicable  Affect:  Not Applicable  Cognitive:  Not Applicable  Insight:  Not Applicable  Engagement in Group: Not Applicable  Additional Comments:  The patient did not attend group this evening.   Nazli Penn S 05/18/2015, 11:03 PM
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
## 2015-05-18 NOTE — [facility] Counselor (Signed)
Adult Comprehensive Assessment  Patient ID: Veronica Guerrant, male   DOB: [birth year], 29 y.o.   MRN: [id number]  Information Source: Information source: Patient  Current Stressors:  Educational / Learning stressors: Pt reports that he wasnts to take more college classes Employment / Job issues: Pt is currently employed at [employer] but is unsure if he will still be employed Family Relationships: None reported Surveyor, quantity / Lack of resources (include bankruptcy): Limited income Housing / Lack of housing: None reported Physical health (include injuries & life threatening diseases): Pt reports frequent headaches Social relationships: None reported Substance abuse: Pt reports that he stopped using marijuana 3-5 months ago Bereavement / Loss: None reported  Living/Environment/Situation:  Living Arrangements: Spouse/significant other Living conditions (as described by patient or guardian): "okay" How long has patient lived in current situation?: 1 year What is atmosphere in current home: Supportive, Comfortable  Family History:  Marital status: Married What types of issues is patient dealing with in the relationship?: Pt reports that the relationship with his wife is "great" and that she is very supportive Does patient have children?: Yes How many children?: 2 How is patient's relationship with their children?: Pt reports that he has a good relationship with his children  Childhood History:  By whom was/is the patient raised?: Both parents Description of patient's relationship with caregiver when they were a child: "normal childhood" Patient's description of current relationship with people who raised him/her: "good"; Parents live outside of [place] Does patient have siblings?: Yes Number of Siblings: 3 Description of patient's current relationship with siblings: reports feeling "close" to brothers Did patient suffer any verbal/emotional/physical/sexual abuse as a child?: No Did patient  suffer from severe childhood neglect?: No Has patient ever been sexually abused/assaulted/raped as an adolescent or adult?: No Was the patient ever a victim of a crime or a disaster?: No Witnessed domestic violence?: No Has patient been effected by domestic violence as an adult?: No  Education:  Highest grade of school patient has completed: 12 Currently a Consulting civil engineer?: No Learning disability?: No  Employment/Work Situation:   Employment situation: Employed Where is patient currently employed?: [employer] How long has patient been employed?: 1 month Patient's job has been impacted by current illness: No What is the longest time patient has a held a job?: 2 years 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  Has patient ever been in the [employer]?: No Has patient ever served in Buyer, retail?: No  Financial Resources:   Financial resources: Income from employment, Income from spouse  Alcohol/Substance Abuse:   What has been your use of drugs/alcohol within the last 12 months?: Pt reports occassional use of alcohol, approximately 1x a month; stopped smoking THC 3-5 months ago If attempted suicide, did drugs/alcohol play a role in this?: No Alcohol/Substance Abuse Treatment Hx: Denies past history Has alcohol/substance abuse ever caused legal problems?: No  Social Support System:   Patient's Community Support System: Good Describe Community Support System: family Type of faith/religion: Grew up [place] "spiritual" How does patient's faith help to cope with current illness?: helps deal with stress  Leisure/Recreation:   Leisure and Hobbies: play ball, laser tag  Strengths/Needs:   What things does the patient do well?: building, being a good  In what areas does patient struggle / problems for patient: did not state  Discharge Plan:   Does patient have access to transportation?: Yes Will patient be returning to same living situation after discharge?: Yes Currently receiving  community mental health services: No If no,  would patient like referral for services when discharged?: Yes (What county?) [facility]/gallery curator([facility]) Does patient have financial barriers related to discharge medications?: Yes Patient description of barriers related to discharge medications: no insurance; limited income  Summary/Recommendations:     Patient is a 29 year old African American male who was admitted with command auditory hallucinations to harm others. During assessment, Pt was cooperative however appeared to be responding to internal stimuli at times AEB looking around the room and inappropriate affect. Pt expressed to CSW that he only "hears voices around him" but could not state if they were coming from people who were actually in the room. Pt continues to express that headaches and GI problems are the reason he came to the hospital along with an inability to sleep well. Pt lives at home with his wife and has supportive family network in the area. Pt expressed that he does not feel that he needs medications at this time but would be interested in therapy. [facility] and [facility] with his wife. Patient will benefit from crisis stabilization, medication evaluation, group therapy and psycho education in addition to case management for discharge planning.     Elaina Hoopsarter, Shaquile Lutze M. 05/18/2015

## 2015-05-18 NOTE — Progress Notes (Signed)
D Gregory DeerChristopher has spent the entire day in his room. HE refuses to admit that he has a mental issue, that he needs medication and that he attend his groups. He did complete his daily assessment and on it he wrote he denied SI and he rated his depression, hopelessness and anxiety " 0/0/0",resepctively.    A HE would not engage in any type of therapeutic conversation with this Clinical research associatewriter today...only says " I m ready to go".    R Safety is in place.

## 2015-05-18 NOTE — Tx Team (Signed)
Interdisciplinary Treatment Plan Update (Adult)  Date:  05/18/2015   Time Reviewed:  8:23 AM   Progress in Treatment: Attending groups: Yes. Participating in groups:  Minimally Taking medication as prescribed:  Yes. Tolerating medication:  Yes. Family/Significant othe contact made:  No Patient understands diagnosis: No  Denies symptoms Discussing patient identified problems/goals with staff:  Yes, see initial care plan. Medical problems stabilized or resolved:  Yes. Denies suicidal/homicidal ideation: Yes. Issues/concerns per patient self-inventory:  No. Other:  New problem(s) identified:  Discharge Plan or Barriers:  Return home, follow up outpt  Reason for Continuation of Hospitalization: Hallucinations Medication stabilization  Comments:  Patient presented with varied complaints. However, after extensive questioning, it became clear that his main problem is psychiatric. He has been having auditory hallucinations for a year and apparently has had thoughts of hurting his spouse. He states that his auditory hallucinations are telling him to hurt his spouse. He denies actual homicidal ideation and denies suicidal ideation. Insomnia I believe is related to his psychiatric problem. Headache appears to be benign and he will be given a headache cocktail.  TTS consult appreciated. Patient with command hallucinations. His mother arrived and 1 and him to go home. However, with command hallucinations and thoughts of hurting his wife, was not felt safe. Patient and his mother were advised that if the to try to leave, he would be placed under involuntary commitment. They are agreeing to stay voluntarily. Denies all symptoms today.  Have not been able to reach family yet, though he did sign a release. Pt reluctant to take meds.  Estimated length of stay: 3-5 days  New goal(s):  Review of initial/current patient goals per problem list:     Attendees: Patient:  05/18/2015 8:23 AM   Family:    05/18/2015 8:23 AM   Physician:  Jomarie LongsSaramma Eappen, MD 05/18/2015 8:23 AM   Nursing:   Liborio NixonPatrice White, RN 05/18/2015 8:23 AM   CSW:    Daryel Geraldodney Aiken Withem, LCSW   05/18/2015 8:23 AM   Other:  05/18/2015 8:23 AM   Other:   05/18/2015 8:23 AM   Other:  Onnie BoerJennifer Dillinger, Nurse CM 05/18/2015 8:23 AM   Other:  Leisa LenzValerie Enoch, [facility] 05/18/2015 8:23 AM   Other:  Tomasita Morrowelora Sutton, P4[facility]  05/18/2015 8:23 AM   Other:  05/18/2015 8:23 AM   Other:  05/18/2015 8:23 AM   Other:  05/18/2015 8:23 AM   Other:  05/18/2015 8:23 AM   Other:  05/18/2015 8:23 AM   Other:   05/18/2015 8:23 AM    Scribe for Treatment Team:   Ida RogueNorth, Oseas Detty B, 05/18/2015 8:23 AM

## 2015-05-18 NOTE — [facility] Group Notes (Signed)
[facility] LCSW Group Therapy 05/18/2015 1:15 PM  Type of Therapy: Group Therapy  Participation Level: Minimal  Participation Quality: Attentive  Affect: Depressed, guarded  Cognitive:  Oriented  Insight: Developing  Engagement in Therapy: Developing  Modes of Intervention: Clarification, Confrontation, Discussion, Education, Exploration, Limit-setting, Orientation, Problem-solving, Rapport Building, Dance movement psychotherapisteality Testing, Socialization and Support  Summary of Progress/Problems: The topic for today was feelings about relapse. Pt discussed what recovery is to them and identified triggers that they are on the path to relapse. Pt processed their feeling towards relapse and was able to relate to peers. Pt discussed coping skills that can be used for relapse prevention. Patient listened attentively, nodded head in agreement but did not actively participate in group.  Is new to unit and adjusting.    Santa GeneraAnne Meiling Hendriks, LCSW Clinical Social Worker

## 2015-05-18 NOTE — H&P (Signed)
Psychiatric Admission Assessment Adult  Patient Identification: Gregory Graham MRN:  [id number] Date of Evaluation:  05/18/2015 Chief Complaint: Patient states " I am not depressed , I may be anxious , but I do not want any medications. 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                                                            R/O Panic disorder  Diagnosis:   Patient Active Problem List   Diagnosis Date Noted  . Psychosis [F29] 05/18/2015  . Anxiety [F41.9] 05/18/2015  . Cannabis use disorder, severe, in early remission [F12.90] 05/18/2015  . Tobacco use disorder [Z72.0] 05/18/2015      History of Present Illness:: Gregory Graham is a 29 y.o. AA male, with no significant past hx of mental illness, presented to ED with complaints of headache, chest pain and trouble sleeping. During the course of his evaluation with EDP he reported mood lability and AH with command. Per initial notes in EHR -" Patient reported depressed and anxious mood, and displayed flat affect. Speech - logical, coherent, soft, and slow. Insight and judgment seem partial to fair. Pt denied self-harm, SI, or current SA. He reported hearing voices outside, nearly all the time, telling him to hurt or kill. Pt was vague about this. He denied intent to act on these voices, or intended victim. Pt did however, tell EDP that voices make comments about him hurting his wife. Pt reported that he started to feel depressed about a year ago, then began to have intense intermittent headaches, followed by onset of AH. Pt reported that he had been using THC since age 18-19 but stopped about 3-5 months ago due to increased paranoia. "  Patient seen this AM. Pt appeared to be withdrawn , pt slow , depressed affect, poor eye contact. Pt also appeared to be a very  limited historian. Pt today reports to writer that he has been having these headaches for several months - his headaches are on the frontal aspect of his head, unable to explain what type , is present atleast 3-4 times a week, relieved by pain management - ibuprofen. Pt also reports his heart as fluttering sometimes - he does report anxiety - but unable to elaborate .  Pt reports feeling tired and more sleepy recently , but denies any depression. Pt denies hopelessness, anhedonia or inability to concentrate . Pt denies SI/AH/VH at this time. Pt reports he does not remember talking about HI towards his wife in the ED and that he has never felt that way . Pt did report sleep issues in ED - but reports to writer that he sleeps too much.  Pt denied any substance to Probation officer - but did endorse cannabis use in the past as well as alcohol use occasional - while in ED.  Pt denies previous hospitalizations, denies suicide attempts.  Pt denies legal issues except when he was in jail for altercation with girlfriend in the past - no pending charges.  Pt work at [employer] , [employer] point .  Elements:  Location:  ANXIETY ,AH. Quality:  see above. Severity:  severe.  Timing:  past few weeks. Duration:  months - worsening . Context:  no past hx of mental illness- hx of cannabis use disorder in remission. Associated Signs/Symptoms: Depression Symptoms:  psychomotor retardation, fatigue, anxiety, (Hypo) Manic Symptoms:  denies Anxiety Symptoms:  denies Psychotic Symptoms:  Paranoia,reported AH - IN ED denies it now. PTSD Symptoms: Negative Total Time spent with patient: 1 hour  Past Medical History: pt denies hx of dm, htn, thyroid ,asthma, mental illness Family History:  Family History  Problem Relation Age of Onset  . Mental illness Neg Hx    Social History:  History  Alcohol Use  . 1.2 oz/week  . 2 Cans of beer per week     History  Drug Use No    History   Social History  . Marital Status:  Married    Spouse Name: N/A  . Number of Children: N/A  . Years of Education: N/A   Social History Main Topics  . Smoking status: Current Every Day Smoker -- 1.00 packs/day    Types: Cigarettes  . Smokeless tobacco: Not on file  . Alcohol Use: 1.2 oz/week    2 Cans of beer per week  . Drug Use: No  . Sexual Activity: Yes     Comment: married   Other Topics Concern  . None   Social History Narrative   Additional Social History:           Patient was born in [place]. Pt went up to 12 th grade. Pt was raised by mom and dad. Pt had a good child hood. Pt believes in God. Pt has been married for 2 years - has 2 children - aged 77 and 28 .                Musculoskeletal: Strength & Muscle Tone: within normal limits Gait & Station: normal Patient leans: N/A  Psychiatric Specialty Exam: Physical Exam  Review of Systems  Constitutional: Positive for weight loss.  Neurological: Positive for headaches.  Psychiatric/Behavioral: The patient is nervous/anxious.   All other systems reviewed and are negative.   Blood pressure 108/75, pulse 85, temperature 98.3 F (36.8 C), temperature source Oral, resp. rate 20, height '5\' 8"'  (1.727 m), weight 71.668 kg (158 lb).Body mass index is 24.03 kg/(m^2).  General Appearance: Fairly Groomed  Engineer, water::  Poor  Speech:  Slow  Volume:  Decreased  Mood:  Anxious  Affect:  Flat  Thought Process:  Coherent  Orientation:  Full (Time, Place, and Person)  Thought Content:  Paranoid Ideation and Rumination  Suicidal Thoughts:  No  Homicidal Thoughts:  No  Memory:  Immediate;   Fair Recent;   Fair Remote;   Fair  Judgement:  Impaired  Insight:  Shallow  Psychomotor Activity:  Decreased  Concentration:  Fair  Recall:  [employer] of Knowledge:Fair  Language: Fair  Akathisia:  No  Handed:  Right  AIMS (if indicated):     Assets:  Physical Health Social Support Vocational/Educational  ADL's:  Intact  Cognition: WNL  Sleep:  Number of  Hours: 5.75   Risk to Self: Is patient at risk for suicide?: No What has been your use of drugs/alcohol within the last 12 months?: Pt reports occassional use of alcohol, approximately 1x a month; stopped smoking THC 3-5 months ago Risk to Others:  yes- pt endorsed HI towards wife in ED Prior Inpatient Therapy:  Denies Prior Outpatient Therapy:   denies Alcohol Screening: 1. How often do you  have a drink containing alcohol?: Monthly or less 2. How many drinks containing alcohol do you have on a typical day when you are drinking?: 1 or 2 3. How often do you have six or more drinks on one occasion?: Never Preliminary Score: 0 9. Have you or someone else been injured as a result of your drinking?: No 10. Has a relative or friend or a doctor or another health worker been concerned about your drinking or suggested you cut down?: No Alcohol Use Disorder Identification Test Final Score (AUDIT): 1 Brief Intervention: Yes  Allergies:  No Known Allergies Lab Results:  Results for orders placed or performed during the hospital encounter of 05/16/15 (from the past 48 hour(s))  CBC     Status: None   Collection Time: 05/16/15  8:46 PM  Result Value Ref Range   WBC 9.1 4.0 - 10.5 K/uL   RBC 5.09 4.22 - 5.81 MIL/uL   Hemoglobin 13.4 13.0 - 17.0 g/dL   HCT 40.2 39.0 - 52.0 %   MCV 79.0 78.0 - 100.0 fL   MCH 26.3 26.0 - 34.0 pg   MCHC 33.3 30.0 - 36.0 g/dL   RDW 13.6 11.5 - 15.5 %   Platelets 288 150 - 400 K/uL  Basic metabolic panel     Status: Abnormal   Collection Time: 05/16/15  8:46 PM  Result Value Ref Range   Sodium 139 135 - 145 mmol/L   Potassium 3.6 3.5 - 5.1 mmol/L   Chloride 103 101 - 111 mmol/L   CO2 26 22 - 32 mmol/L   Glucose, Bld 124 (H) 65 - 99 mg/dL   BUN 6 6 - 20 mg/dL   Creatinine, Ser 1.07 0.61 - 1.24 mg/dL   Calcium 9.3 8.9 - 10.3 mg/dL   GFR calc non Af Amer >60 >60 mL/min   GFR calc Af Amer >60 >60 mL/min    Comment: (NOTE) The eGFR has been calculated using the  CKD EPI equation. This calculation has not been validated in all clinical situations. eGFR's persistently <60 mL/min signify possible Chronic Kidney Disease.    Anion gap 10 5 - 15  BNP (order ONLY if patient complains of dyspnea/SOB AND you have documented it for THIS visit)     Status: None   Collection Time: 05/16/15  8:46 PM  Result Value Ref Range   B Natriuretic Peptide 6.2 0.0 - 100.0 pg/mL  Hepatic function panel     Status: Abnormal   Collection Time: 05/16/15  8:50 PM  Result Value Ref Range   Total Protein 8.2 (H) 6.5 - 8.1 g/dL   Albumin 4.5 3.5 - 5.0 g/dL   AST 17 15 - 41 U/L   ALT 12 (L) 17 - 63 U/L   Alkaline Phosphatase 51 38 - 126 U/L   Total Bilirubin 0.5 0.3 - 1.2 mg/dL   Bilirubin, Direct 0.1 0.1 - 0.5 mg/dL   Indirect Bilirubin 0.4 0.3 - 0.9 mg/dL  Ethanol     Status: None   Collection Time: 05/16/15  8:50 PM  Result Value Ref Range   Alcohol, Ethyl (B) <5 <5 mg/dL    Comment:        LOWEST DETECTABLE LIMIT FOR SERUM ALCOHOL IS 5 mg/dL FOR MEDICAL PURPOSES ONLY   I-stat troponin, ED  (not at [facility], [facility])     Status: None   Collection Time: 05/16/15  9:15 PM  Result Value Ref Range   Troponin i, poc 0.00 0.00 - 0.08 ng/mL  Comment 3            Comment: Due to the release kinetics of cTnI, a negative result within the first hours of the onset of symptoms does not rule out myocardial infarction with certainty. If myocardial infarction is still suspected, repeat the test at appropriate intervals.   Urine rapid drug screen (hosp performed)     Status: None   Collection Time: 05/17/15 11:00 AM  Result Value Ref Range   Opiates NONE DETECTED NONE DETECTED   Cocaine NONE DETECTED NONE DETECTED   Benzodiazepines NONE DETECTED NONE DETECTED   Amphetamines NONE DETECTED NONE DETECTED   Tetrahydrocannabinol NONE DETECTED NONE DETECTED   Barbiturates NONE DETECTED NONE DETECTED    Comment:        DRUG SCREEN FOR MEDICAL PURPOSES ONLY.  IF CONFIRMATION IS  NEEDED FOR ANY PURPOSE, NOTIFY LAB WITHIN 5 DAYS.        LOWEST DETECTABLE LIMITS FOR URINE DRUG SCREEN Drug Class       Cutoff (ng/mL) Amphetamine      1000 Barbiturate      200 Benzodiazepine   622 Tricyclics       633 Opiates          300 Cocaine          300 THC              50    Current Medications: Current Facility-Administered Medications  Medication Dose Route Frequency Provider Last Rate Last Dose  . amitriptyline (ELAVIL) tablet 25 mg  25 mg Oral QHS PRN Ursula Alert, MD      . nicotine polacrilex (NICORETTE) gum 2 mg  2 mg Oral PRN Ursula Alert, MD      . traZODone (DESYREL) tablet 50 mg  50 mg Oral QHS PRN Kerrie Buffalo, NP       PTA Medications: No prescriptions prior to admission    Previous Psychotropic Medications: No   Substance Abuse History in the last 12 months:  Yes.  cannabis, stopped using a few months ago, alcohol occasional, tobacco    Consequences of Substance Abuse: NA  Results for orders placed or performed during the hospital encounter of 05/16/15 (from the past 72 hour(s))  CBC     Status: None   Collection Time: 05/16/15  8:46 PM  Result Value Ref Range   WBC 9.1 4.0 - 10.5 K/uL   RBC 5.09 4.22 - 5.81 MIL/uL   Hemoglobin 13.4 13.0 - 17.0 g/dL   HCT 40.2 39.0 - 52.0 %   MCV 79.0 78.0 - 100.0 fL   MCH 26.3 26.0 - 34.0 pg   MCHC 33.3 30.0 - 36.0 g/dL   RDW 13.6 11.5 - 15.5 %   Platelets 288 150 - 400 K/uL  Basic metabolic panel     Status: Abnormal   Collection Time: 05/16/15  8:46 PM  Result Value Ref Range   Sodium 139 135 - 145 mmol/L   Potassium 3.6 3.5 - 5.1 mmol/L   Chloride 103 101 - 111 mmol/L   CO2 26 22 - 32 mmol/L   Glucose, Bld 124 (H) 65 - 99 mg/dL   BUN 6 6 - 20 mg/dL   Creatinine, Ser 1.07 0.61 - 1.24 mg/dL   Calcium 9.3 8.9 - 10.3 mg/dL   GFR calc non Af Amer >60 >60 mL/min   GFR calc Af Amer >60 >60 mL/min    Comment: (NOTE) The eGFR has been calculated using the CKD EPI equation. This  calculation has  not been validated in all clinical situations. eGFR's persistently <60 mL/min signify possible Chronic Kidney Disease.    Anion gap 10 5 - 15  BNP (order ONLY if patient complains of dyspnea/SOB AND you have documented it for THIS visit)     Status: None   Collection Time: 05/16/15  8:46 PM  Result Value Ref Range   B Natriuretic Peptide 6.2 0.0 - 100.0 pg/mL  Hepatic function panel     Status: Abnormal   Collection Time: 05/16/15  8:50 PM  Result Value Ref Range   Total Protein 8.2 (H) 6.5 - 8.1 g/dL   Albumin 4.5 3.5 - 5.0 g/dL   AST 17 15 - 41 U/L   ALT 12 (L) 17 - 63 U/L   Alkaline Phosphatase 51 38 - 126 U/L   Total Bilirubin 0.5 0.3 - 1.2 mg/dL   Bilirubin, Direct 0.1 0.1 - 0.5 mg/dL   Indirect Bilirubin 0.4 0.3 - 0.9 mg/dL  Ethanol     Status: None   Collection Time: 05/16/15  8:50 PM  Result Value Ref Range   Alcohol, Ethyl (B) <5 <5 mg/dL    Comment:        LOWEST DETECTABLE LIMIT FOR SERUM ALCOHOL IS 5 mg/dL FOR MEDICAL PURPOSES ONLY   I-stat troponin, ED  (not at [facility], [facility])     Status: None   Collection Time: 05/16/15  9:15 PM  Result Value Ref Range   Troponin i, poc 0.00 0.00 - 0.08 ng/mL   Comment 3            Comment: Due to the release kinetics of cTnI, a negative result within the first hours of the onset of symptoms does not rule out myocardial infarction with certainty. If myocardial infarction is still suspected, repeat the test at appropriate intervals.   Urine rapid drug screen (hosp performed)     Status: None   Collection Time: 05/17/15 11:00 AM  Result Value Ref Range   Opiates NONE DETECTED NONE DETECTED   Cocaine NONE DETECTED NONE DETECTED   Benzodiazepines NONE DETECTED NONE DETECTED   Amphetamines NONE DETECTED NONE DETECTED   Tetrahydrocannabinol NONE DETECTED NONE DETECTED   Barbiturates NONE DETECTED NONE DETECTED    Comment:        DRUG SCREEN FOR MEDICAL PURPOSES ONLY.  IF CONFIRMATION IS NEEDED FOR ANY PURPOSE, NOTIFY  LAB WITHIN 5 DAYS.        LOWEST DETECTABLE LIMITS FOR URINE DRUG SCREEN Drug Class       Cutoff (ng/mL) Amphetamine      1000 Barbiturate      200 Benzodiazepine   673 Tricyclics       419 Opiates          300 Cocaine          300 THC              50     Observation Level/Precautions:  15 minute checks  Laboratory:  tsh - if not already done  Psychotherapy:  Individual and group therapy   Medications:  As needed  Consultations: Social worker  Discharge Concerns:  Stability and safety  Psychological Evaluations: No   Treatment Plan Summary: Daily contact with patient to assess and evaluate symptoms and progress in treatment and Medication management  Patient will benefit from inpatient treatment and stabilization.  Estimated length of stay is 5-7 days.  Reviewed past medical records,treatment plan.  Patient at this time has been refusing  to take any medications.It was discussed with patient about adding an antidepressant as well as an antipsychotic agent . Pt refuses- reports he is not depressed - nor does he have AH.  Elavil 25 mg po qhs prn added for headaches/anxiety/sleep - could make it scheduled if he agrees - however should be cautious about its effect on his heart due to his hx of heart palpitation. CSW will get more collateral information from wife. Provided smoking cessation counseling - will give nicotine gum. Will continue to monitor vitals ,medication compliance and treatment side effects while patient is here.  Will monitor for medical issues as well as call consult as needed.  Reviewed labs CBC, CMP,UDS,BAL- will get TSH. CSW will start working on disposition.  Patient to participate in therapeutic milieu .       Medical Decision Making:  Review of Psycho-Social Stressors (1), Review or order clinical lab tests (1), Established Problem, Worsening (2), Review of Medication Regimen & Side Effects (2) and Review of New Medication or Change in Dosage (2)  I  certify that inpatient services furnished can reasonably be expected to improve the patient's condition.   Bernadetta Roell MD 7/1/20161:14 PM

## 2015-05-19 ENCOUNTER — Encounter (HOSPITAL_COMMUNITY): Payer: Self-pay | Admitting: Psychiatry

## 2015-05-19 DIAGNOSIS — F323 Major depressive disorder, single episode, severe with psychotic features: Principal | ICD-10-CM

## 2015-05-19 HISTORY — DX: Major depressive disorder, single episode, severe with psychotic features: F32.3

## 2015-05-19 MED ORDER — HALOPERIDOL 5 MG PO TABS
5.0000 mg | ORAL_TABLET | Freq: Every day | ORAL | Status: DC
  Filled 2015-05-19 (×6): qty 1

## 2015-05-19 MED ORDER — CITALOPRAM HYDROBROMIDE 10 MG PO TABS
10.0000 mg | ORAL_TABLET | Freq: Every day | ORAL | Status: DC
  Filled 2015-05-19 (×6): qty 1

## 2015-05-19 NOTE — Progress Notes (Signed)
Writer observed patient up in the dayroom briefly watching tv but no interaction with peers. His wife visited on this evening and spoke with Clinical research associatewriter concerning his care. Writer informed his wife that he is refusing any medication offered to aid with sleep or scheduled medication. She reports trying to convince him to take medication if needing it but has been unsuccessful. Writer spoke with him 1:1 and informed him that information he reported at the ED is the reason he is here and medications ordered but it is up to him to be honest with himself if he is hearing voices and to try the medication available. He was receptive of advice given and decided not to take the medication. He denies si/hi/a/v hallucinations. Support and encouragement given, safety maintained with 15 min checks.

## 2015-05-19 NOTE — [facility] Group Notes (Signed)
[facility] LCSW Group Therapy Note  05/19/2015 1:15 PM  Type of Therapy and Topic:  Group Therapy: Avoiding Self-Sabotaging and Enabling Behaviors  Participation Level:  Minimal   Description of Group:   Learn how to identify obstacles, self-sabotaging and enabling behaviors, what are they, why do we do them and what needs do these behaviors meet? Discuss unhealthy relationships and how to have positive healthy boundaries with those that sabotage and enable. Explore aspects of self-sabotage and enabling in yourself and how to limit these self-destructive behaviors in everyday life.  Therapeutic Goals: 1. Patient will identify one obstacle that relates to self-sabotage and enabling behaviors 2. Patient will identify one personal self-sabotaging or enabling behavior they did prior to admission 3. Patient able to establish a plan to change the above identified behavior they did prior to admission:  4. Patient will demonstrate ability to communicate their needs through discussion and/or role plays.   Summary of Patient Progress: The main focus of today's process group was to explain to the adolescent what "self-sabotage" means and use Motivational Interviewing to discuss what benefits, negative or positive, were involved in a self-identified self-sabotaging behavior. We then talked about reasons the patient may want to change the behavior and their current desire to change. Patient appeared flat and hesitant to participate as evidenced by short answers and avoidance of eye contact. Patient did report that his temper has a negative impact on his well being as does his tendency to isolate. Others offered support and encouragement and shared how Thayer OhmChris had joined in a game of cards with them although they knew it was difficult for him. Patient smiled broadly at the praise but was otherwise flat and quiet.   Therapeutic Modalities:   Cognitive Behavioral Therapy Person-Centered Therapy Motivational  Interviewing   Carney Bernatherine C Nancy Arvin, LCSW

## 2015-05-19 NOTE — Progress Notes (Signed)
Gregory Graham is seen OOB UAL on the 500 hall today...tolerated fair. HE stays in his room. HE does not bring attention to himself, nor does he initiate conversation with staff.  He denies SI and rates his depression, hopelessness and anxiety " 0/0/0", respectively. He is adamant that he " did not" make statements about wanting to hurt others... / or hearing  Voices telling him to harm others.    A He is given order today for haldol 5 mg po qhs and celexa 10 mg po qd. Currently he is refusing both of these meds.   R Safety is in place. Pt cont to be angy, in denial and refusing help from staff. Will cont to offer support and facilitate theapetuic relationship.

## 2015-05-19 NOTE — Progress Notes (Addendum)
[facility] MD Progress Note  05/19/2015 4:26 PM Gregory Graham  MRN:  [id number] Subjective:   Patient states "I was hearing things for several months. It is not happening now. I'm been under some stress lately. I went to the ED for medical evaluation. I'm not really sure why I am here."  Objective:  Patient seen and chart is reviewed. The patient is very guarded during assessment today and is focused on previous medical complaints. But when asked about continuation of headaches and chest pain the patient denies. Consult note from Dr. Lolly Mustache indicates the patient admitted to hearing voices to hurt or kill people. He continues to appear very paranoid and has been isolating in his room. His urine drug screen is negative. The patient denies making statements about harming his wife. The patient has not been started on antipsychotic medication due to refusal per Dr. Elvera Maria note yesterday. Deklin appears to be greatly minimizing his symptoms today denying any voices but he has also not received any treatment. Nursing notes indicate that the patient has spent all day in his room and informed staff of his desire to be discharged. He appears to have many risk factors for self harm and potential for violence towards others. Patient previously reported that his voices were command in nature but today he is not forthcoming about his psychiatric symptoms. The patient is often very vague with answers and forwards very little information. He denies any history of prior psychiatric treatment. Prior notes appear to indicate symptoms that are consistent with depression to decreased appetite, lack of motivation, and decreased level of functioning.   Principal Problem: MDD (major depressive disorder), single episode, severe with psychotic features Diagnosis:   Patient Active Problem List   Diagnosis Date Noted  . MDD (major depressive disorder), single episode, severe with psychotic features [F32.3] 05/19/2015  .  Psychosis [F29] 05/18/2015  . Anxiety [F41.9] 05/18/2015  . Cannabis use disorder, severe, in early remission [F12.90] 05/18/2015  . Tobacco use disorder [Z72.0] 05/18/2015   Total Time spent with patient: 20 minutes  Past Medical History:  Past Medical History  Diagnosis Date  . MDD (major depressive disorder), single episode, severe with psychotic features 05/19/2015   History reviewed. No pertinent past surgical history. Family History:  Family History  Problem Relation Age of Onset  . Mental illness Neg Hx    Social History:  History  Alcohol Use  . 1.2 oz/week  . 2 Cans of beer per week     History  Drug Use No    History   Social History  . Marital Status: Married    Spouse Name: N/A  . Number of Children: N/A  . Years of Education: N/A   Social History Main Topics  . Smoking status: Current Every Day Smoker -- 1.00 packs/day    Types: Cigarettes  . Smokeless tobacco: Not on file  . Alcohol Use: 1.2 oz/week    2 Cans of beer per week  . Drug Use: No  . Sexual Activity: Yes     Comment: married   Other Topics Concern  . None   Social History Narrative   Additional History:    Sleep: Fair  Appetite:  Fair  Assessment:   Musculoskeletal: Strength & Muscle Tone: within normal limits Gait & Station: normal Patient leans: N/A  Psychiatric Specialty Exam: Physical Exam  Psychiatric: His speech is delayed. He is withdrawn. Thought content is paranoid. Cognition and memory are normal. He exhibits a depressed mood.  Review of Systems  Constitutional: Negative for fever, chills, weight loss and malaise/fatigue.  HENT: Negative for ear pain, hearing loss and tinnitus.   Eyes: Negative for blurred vision, double vision, photophobia, pain and discharge.  Respiratory: Negative for cough, hemoptysis, sputum production and shortness of breath.   Cardiovascular: Negative for chest pain, palpitations, orthopnea, claudication and leg swelling.   Gastrointestinal: Negative for heartburn, nausea, vomiting, abdominal pain, diarrhea and constipation.  Genitourinary: Negative for dysuria, urgency, frequency and hematuria.  Musculoskeletal: Negative for myalgias, back pain, joint pain and neck pain.  Skin: Negative for itching and rash.  Neurological: Negative for dizziness, tingling, tremors, sensory change, speech change and headaches.  Endo/Heme/Allergies: Negative for environmental allergies and polydipsia. Does not bruise/bleed easily.  Psychiatric/Behavioral: Positive for depression, suicidal ideas and hallucinations. The patient is nervous/anxious and has insomnia.     Blood pressure 114/80, pulse 94, temperature 98.8 F (37.1 C), temperature source Oral, resp. rate 16, height 5\' 8"  (1.727 m), weight 71.668 kg (158 lb).Body mass index is 24.03 kg/(m^2).  General Appearance: Casual  Eye Contact::  Poor  Speech:  Slow  Volume:  Decreased  Mood:  Depressed  Affect:  Constricted  Thought Process:  Coherent  Orientation:  Full (Time, Place, and Person)  Thought Content:  Paranoid Ideation and Rumination  Suicidal Thoughts:  No  Homicidal Thoughts:  No  Memory:  Immediate;   Fair Recent;   Poor Remote;   Poor  Judgement:  Impaired  Insight:  Lacking  Psychomotor Activity:  Decreased  Concentration:  Fair  Recall:  [employer]Fair  Fund of Knowledge:Fair  Language: Fair  Akathisia:  No  Handed:  Right  AIMS (if indicated):     Assets:  Physical Health Social Support Vocational/Educational  ADL's:  Intact  Cognition: WNL  Sleep:  Number of Hours: 6.5   Current Medications: Current Facility-Administered Medications  Medication Dose Route Frequency Provider Last Rate Last Dose  . acetaminophen (TYLENOL) tablet 650 mg  650 mg Oral Q6H PRN Worthy FlankIjeoma E Nwaeze, NP      . amitriptyline (ELAVIL) tablet 25 mg  25 mg Oral QHS PRN Jomarie LongsSaramma Eappen, MD      . citalopram (CELEXA) tablet 10 mg  10 mg Oral Daily Thermon LeylandLaura A Davis, NP      .  haloperidol (HALDOL) tablet 5 mg  5 mg Oral QHS Thermon LeylandLaura A Davis, NP      . hydrOXYzine (ATARAX/VISTARIL) tablet 25 mg  25 mg Oral TID PRN Worthy FlankIjeoma E Nwaeze, NP      . nicotine polacrilex (NICORETTE) gum 2 mg  2 mg Oral PRN Jomarie LongsSaramma Eappen, MD      . traZODone (DESYREL) tablet 50 mg  50 mg Oral QHS PRN Adonis BrookSheila Agustin, NP        Lab Results: No results found for this or any previous visit (from the past 48 hour(s)).  Physical Findings: AIMS: Facial and Oral Movements Muscles of Facial Expression: None, normal Lips and Perioral Area: None, normal Jaw: None, normal Tongue: None, normal,Extremity Movements Upper (arms, wrists, hands, fingers): None, normal Lower (legs, knees, ankles, toes): None, normal, Trunk Movements Neck, shoulders, hips: None, normal, Overall Severity Severity of abnormal movements (highest score from questions above): None, normal Incapacitation due to abnormal movements: None, normal Patient's awareness of abnormal movements (rate only patient's report): No Awareness, Dental Status Current problems with teeth and/or dentures?: No Does patient usually wear dentures?: No  CIWA:  CIWA-Ar Total: 0 COWS:  COWS Total Score: 0  Treatment Plan Summary: Daily contact with patient to assess and evaluate symptoms and progress in treatment and Medication management  Continue crisis management and stabilization.  Medication management:   -Initiate Haldol 5 mg at hs for psychosis -Initiate Celexa 10 mg daily for affective symptoms -Continue Trazodone 50 mg hs prn insomnia Will monitor for medication compliance as patient has been resistant to psychiatric treatment. CSW to obtain collateral information from family. Encouraged patient to attend groups and participate in group counseling sessions and activities.  Discharge plan in progress.  Continue current treatment plan.  Address health issues: Vitals reviewed and stable.   Medical Decision Making:  New problem, with additional  work up planned, Review of Psycho-Social Stressors (1), Review or order clinical lab tests (1) and Review of Medication Regimen & Side Effects (2)  DAVIS, LAURA NP-C 05/19/2015, 4:26 PM I agree with assessment and plan Madie Reno A. Dub Mikes, M.D.

## 2015-05-19 NOTE — Progress Notes (Signed)
Patient has remained isolative to his room room this evening and denies si/hi/a/v hallucinations. Writer spoke with him briefly 1:1 and he was guarded. He did not attend group this evening and writer informed him of medication available if needed but he refused. Writer offered him a snack and he came to the dayroom for snack and returned to his room. Safety maintained on unit with 15 min checks.

## 2015-05-19 NOTE — Progress Notes (Signed)
[facility] Group Notes:  (Nursing/MHT/Case Management/Adjunct)  Date:  05/19/2015  Time:  10:28 PM  Type of Therapy:  Psychoeducational Skills  Participation Level:  Active  Participation Quality:  Appropriate  Affect:  Flat  Cognitive:  Appropriate  Insight:  Improving  Engagement in Group:  Improving  Modes of Intervention:  Education  Summary of Progress/Problems: The patient shared with the group that he had an "okay" day overall. He states that he wants "to slow down". As a theme for the day, his coping skill is to "find an alternative to getting angry".   Hazle CocaGOODMAN, Trevino Wyatt S 05/19/2015, 10:28 PM

## 2015-05-19 NOTE — Plan of Care (Signed)
Problem: Alteration in mood; excessive anxiety as evidenced by: Goal: STG-Pt will report an absence of self-harm thoughts/actions (Patient will report an absence of self-harm thoughts or actions)  Outcome: Progressing Patient currently denies suicidal and homicidal ideations, He also denies auditory and visual hallucinations.

## 2015-05-20 DIAGNOSIS — F322 Major depressive disorder, single episode, severe without psychotic features: Secondary | ICD-10-CM

## 2015-05-20 NOTE — Progress Notes (Signed)
Patient ID: Gregory Graham, male   DOB: 1986/05/13, 29 y.o.   MRN: [id number] [facility] MD Progress Note  05/20/2015 4:30 PM Mauricio Dahlen  MRN:  [id number] Subjective:   Patient states "I was just stressed.  I haven't been able to get a job in while.  I went for one and didn't go my way.  I just felt some kind of way about it.  It's hard for a man not to have a job." Objective:  Patient seen and chart is reviewed. The patient remains guarded during assessment today.  He is however, more engaged and making good eye contact.  He is unable to recall, per previous history of wanting to kill or hurt.  He has a puzzled look on his face.  His urine drug screen is negative. The patient denies making statements about harming his wife.  When asked about his refusal to take antipsychotic medication, patient states he does not need them.  Informed this NP of his desire to be discharged. He appears to have many risk factors for self harm and potential for violence towards others.  He did attend groups and walked in the hallways.  The patient is often very vague with answers and forwards very little information. He denies any history of prior psychiatric treatment. Prior notes appear to indicate symptoms that are consistent with depression to decreased appetite, lack of motivation, and decreased level of functioning.   Principal Problem: MDD (major depressive disorder), single episode, severe with psychotic features Diagnosis:   Patient Active Problem List   Diagnosis Date Noted  . MDD (major depressive disorder), single episode, severe with psychotic features [F32.3] 05/19/2015  . Psychosis [F29] 05/18/2015  . Anxiety [F41.9] 05/18/2015  . Cannabis use disorder, severe, in early remission [F12.90] 05/18/2015  . Tobacco use disorder [Z72.0] 05/18/2015   Total Time spent with patient: 20 minutes  Past Medical History:  Past Medical History  Diagnosis Date  . MDD (major depressive disorder), single episode,  severe with psychotic features 05/19/2015   History reviewed. No pertinent past surgical history. Family History:  Family History  Problem Relation Age of Onset  . Mental illness Neg Hx    Social History:  History  Alcohol Use  . 1.2 oz/week  . 2 Cans of beer per week     History  Drug Use No    History   Social History  . Marital Status: Married    Spouse Name: N/A  . Number of Children: N/A  . Years of Education: N/A   Social History Main Topics  . Smoking status: Current Every Day Smoker -- 1.00 packs/day    Types: Cigarettes  . Smokeless tobacco: Not on file  . Alcohol Use: 1.2 oz/week    2 Cans of beer per week  . Drug Use: No  . Sexual Activity: Yes     Comment: married   Other Topics Concern  . None   Social History Narrative   Additional History:    Sleep: Fair  Appetite:  Fair  Assessment:   Musculoskeletal: Strength & Muscle Tone: within normal limits Gait & Station: normal Patient leans: N/A  Psychiatric Specialty Exam: Physical Exam  Psychiatric: His speech is delayed. He is withdrawn. Thought content is paranoid. Cognition and memory are normal. He exhibits a depressed mood.    Review of Systems  Constitutional: Negative for fever, chills, weight loss and malaise/fatigue.  HENT: Negative for ear pain, hearing loss and tinnitus.   Eyes: Negative for blurred vision,  double vision, photophobia, pain and discharge.  Respiratory: Negative for cough, hemoptysis, sputum production and shortness of breath.   Cardiovascular: Negative for chest pain, palpitations, orthopnea, claudication and leg swelling.  Gastrointestinal: Negative for heartburn, nausea, vomiting, abdominal pain, diarrhea and constipation.  Genitourinary: Negative for dysuria, urgency, frequency and hematuria.  Musculoskeletal: Negative for myalgias, back pain, joint pain and neck pain.  Skin: Negative for itching and rash.  Neurological: Negative for dizziness, tingling, tremors,  sensory change, speech change and headaches.  Endo/Heme/Allergies: Negative for environmental allergies and polydipsia. Does not bruise/bleed easily.  Psychiatric/Behavioral: Positive for depression, suicidal ideas and hallucinations. The patient is nervous/anxious and has insomnia.     Blood pressure 112/71, pulse 96, temperature 98.1 F (36.7 C), temperature source Oral, resp. rate 16, height  (1.727 m), weight 71.668 kg (158 lb).Body mass index is 24.03 kg/(m^2).  General Appearance: Casual  Eye Contact::  Poor  Speech:  Slow  Volume:  Decreased  Mood:  Depressed  Affect:  Constricted  Thought Process:  Coherent  Orientation:  Full (Time, Place, and Person)  Thought Content:  Paranoid Ideation and Rumination  Suicidal Thoughts:  No  Homicidal Thoughts:  No  Memory:  Immediate;   Fair Recent;   Poor Remote;   Poor  Judgement:  Impaired  Insight:  Lacking  Psychomotor Activity:  Decreased  Concentration:  Fair  Recall:  [employer] of Knowledge:Fair  Language: Fair  Akathisia:  No  Handed:  Right  AIMS (if indicated):     Assets:  Physical Health Social Support Vocational/Educational  ADL's:  Intact  Cognition: WNL  Sleep:  Number of Hours: 6.5   Current Medications: Current Facility-Administered Medications  Medication Dose Route Frequency Provider Last Rate Last Dose  . acetaminophen (TYLENOL) tablet 650 mg  650 mg Oral Q6H PRN Worthy Flank, NP   650 mg at 05/19/15 1822  . amitriptyline (ELAVIL) tablet 25 mg  25 mg Oral QHS PRN Jomarie Longs, MD      . citalopram (CELEXA) tablet 10 mg  10 mg Oral Daily Thermon Leyland, NP   10 mg at 05/19/15 1700  . haloperidol (HALDOL) tablet 5 mg  5 mg Oral QHS Thermon Leyland, NP   5 mg at 05/19/15 2209  . hydrOXYzine (ATARAX/VISTARIL) tablet 25 mg  25 mg Oral TID PRN Worthy Flank, NP      . nicotine polacrilex (NICORETTE) gum 2 mg  2 mg Oral PRN Jomarie Longs, MD      . traZODone (DESYREL) tablet 50 mg  50 mg Oral QHS PRN  Adonis Brook, NP        Lab Results: No results found for this or any previous visit (from the past 48 hour(s)).  Physical Findings: AIMS: Facial and Oral Movements Muscles of Facial Expression: None, normal Lips and Perioral Area: None, normal Jaw: None, normal Tongue: None, normal,Extremity Movements Upper (arms, wrists, hands, fingers): None, normal Lower (legs, knees, ankles, toes): None, normal, Trunk Movements Neck, shoulders, hips: None, normal, Overall Severity Severity of abnormal movements (highest score from questions above): None, normal Incapacitation due to abnormal movements: None, normal Patient's awareness of abnormal movements (rate only patient's report): No Awareness, Dental Status Current problems with teeth and/or dentures?: No Does patient usually wear dentures?: No  CIWA:  CIWA-Ar Total: 0 COWS:  COWS Total Score: 0  Treatment Plan Summary: Daily contact with patient to assess and evaluate symptoms and progress in treatment and Medication management  Continue  crisis management and stabilization.  Medication management:   -Initiate Haldol 5 mg at hs for psychosis (refusing) -Initiate Celexa 10 mg daily for affective symptoms  (refusing) -Continue Trazodone 50 mg hs prn insomnia Will monitor for medication compliance as patient has been resistant to psychiatric treatment. CSW to obtain collateral information from family. Encouraged patient to attend groups and participate in group counseling sessions and activities.  Discharge plan in progress.  Continue current treatment plan.  Address health issues: Vitals reviewed and stable.   Medical Decision Making:  New problem, with additional work up planned, Review of Psycho-Social Stressors (1), Review or order clinical lab tests (1) and Review of Medication Regimen & Side Effects (2)  Velna HatchetSheila May Agustin AGNP-BC 05/20/2015, 4:30 PM I agree with assessment and plan Madie RenoIrving A. Dub MikesLugo, M.D.

## 2015-05-20 NOTE — Progress Notes (Signed)
Writer spoke with patient 1:1 and he reports having had a good day. He was hoping to discharge on today but is okay with still being here. He attended group this evening and has been observed up in the dayroom a little more this evening watching tv. He had a visit from his wife today and is hoping to be home on tomorrow for the family cookout. He denies si/hi/a/v hallucinations. He did not want to take his scheduled medication tonight and reports that he does not need it. Support and encouragement given, safety maintained on unit with 15 min checks.

## 2015-05-20 NOTE — Progress Notes (Signed)
[facility] Group Notes:  (Nursing/MHT/Case Management/Adjunct)  Date:  05/20/2015  Time:  9:25 PM  Type of Therapy:  Psychoeducational Skills  Participation Level:  Active  Participation Quality:  Appropriate  Affect:  Appropriate  Cognitive:  Appropriate  Insight:  Good  Engagement in Group:  Engaged  Modes of Intervention:  Education  Summary of Progress/Problems: The patient indicated that he had a good day and that he was grateful for being able to wake up. In terms of the theme of the day, his support system will consist of his family, aunt, and wife.   Hazle CocaGOODMAN, Javell Blackburn S 05/20/2015, 9:25 PM

## 2015-05-20 NOTE — [facility] Group Notes (Signed)
[facility] Group Notes:  (Nursing/MHT/Case Management/Adjunct)  Date:  05/20/2015  Time:  10:54 AM  Type of Therapy:  Nurse Education  Participation Level:  Active  Participation Quality:  Appropriate  Affect:  Appropriate  Cognitive:  Appropriate  Insight:  Appropriate  Engagement in Group:  Engaged  Modes of Intervention:  Education  Summary of Progress/Problems:  Rich BraveDuke, Casee Knepp Lynn 05/20/2015, 10:54 AM

## 2015-05-20 NOTE — [facility] Group Notes (Signed)
[facility] Group Notes:  (Nursing/MHT/Case Management/Adjunct)  Date:  05/20/2015  Time:  12:05 PM  Type of Therapy:  Psychoeducational Skills  Participation Level:   Good. Participation Quality:  Good  Affect:  Flat  Cognitive:  Intact  Insight:  Limited  Engagement in Group:  Engaged  Modes of Intervention:    Summary of Progress/Problems:  Gregory Graham, [name] 05/20/2015, 12:05 PM

## 2015-05-20 NOTE — Progress Notes (Signed)
Gregory Graham  Is status quo...sitting in his room in the quiet. Isolating. Refusing his medications. Refusing to talk about his problems. HE is adamant that he is going home and " I don't need to be here".   A This morning, though, he is less guarded. HE makes better eye contqact. HE is present in both of his psycho-ed groups held today. He completed his daily assessment and on it he wrote he deneid SI and he rated his derpession, hopelessness and anxiety " 0/0/0", respectively.    R Safety is in place. Will cont to encourage pt to be hosnest with staff and more importantly, himslef, about his reasons for being here...  allow staff to speak with him and begin  To work on his issues.

## 2015-05-20 NOTE — [facility] Group Notes (Signed)
[facility] LCSW Group Therapy  05/20/2015  1:15 PM  Type of Therapy:  Group Therapy  Participation Level:  Minimal  Participation Quality:  Drowsy and Resistant  Affect:  Flat  Cognitive:  Lacking  Insight:  Lacking  Engagement in Therapy:  Lacking  Modes of Intervention:  Discussion, Exploration, Socialization and Support  Summary of Progress/Problems: Topic for today was thoughts and feelings regarding discharge. We discussed fears of upcoming changes including judegements, expectations and stigma of mental health issues. We then discussed supports: what constitutes a supportive framework, identification of supports and what to do when others are not supportive. Patient's then identified a specific coping tool to use when others are not available.  Patient continues to engage minimally in group yet acknowledges anger management is an issue.   Carney Bernatherine C Fiore Detjen, LCSW

## 2015-05-21 NOTE — Progress Notes (Signed)
D: Pt denies SI/HI/AVH. Pt is pleasant and cooperative. Pt has a lot of stress and has a "fear of Failing". Pt concerned about his relationship with his wife and appears to be having family problems with other members of his family.   A: Pt was offered support and encouragement. Pt was given scheduled medications. Pt was encourage to attend groups. Q 15 minute checks were done for safety.   R:Pt attends groups and interacts well with peers and staff. Pt is taking medication. Pt has no complaints at this time .Pt receptive to treatment and safety maintained on unit.

## 2015-05-21 NOTE — Progress Notes (Signed)
Did not attend group 

## 2015-05-21 NOTE — Progress Notes (Signed)
Patient ID: Gregory Graham, male   DOB: [birth year], 29 y.o.   MRN: [id number] [facility] MD Progress Note  05/21/2015 5:29 PM Gregory Graham  MRN:  [id number] Subjective:   Patient states "I'm good. I do not need any medication. Maybe I could see a therapist for a while but that is it."   Objective:  Patient seen and chart is reviewed. The patient remains guarded during assessments.  He is however, more engaged and making good eye contact.  He is unable to recall, per previous history of wanting to kill or hurt.  His urine drug screen is negative. The patient denies making statements about harming his wife.  When asked about his refusal to take antipsychotic medication, patient states he does not need them. He has informed many staff of his desire to be discharged. He appears to have many risk factors for self harm and potential for violence towards others.  He did attend groups and walked in the hallways.  The patient is often very vague with answers and forwards very little information. He denies any history of prior psychiatric treatment. Patient was encouraged to take medication for depression as moving forward his symptoms may reemerge. He was educated regarding the potential adverse effects of untreated depression such as psychosis. Odilon was polite in listening to explanation but remains adamant in his position regarding medications. The social worker has met with patient's family who report that the patient appears stable for discharge. The patient has not been observed responding to internal stimuli. He has minimal interaction on the unit but remains functional in his daily activities such as hygiene. His mother and wife have been visiting daily.   Principal Problem: MDD (major depressive disorder), single episode, severe with psychotic features Diagnosis:   Patient Active Problem List   Diagnosis Date Noted  . MDD (major depressive disorder), single episode, severe with psychotic features  [F32.3] 05/19/2015  . Psychosis [F29] 05/18/2015  . Anxiety [F41.9] 05/18/2015  . Cannabis use disorder, severe, in early remission [F12.90] 05/18/2015  . Tobacco use disorder [Z72.0] 05/18/2015   Total Time spent with patient: 20 minutes  Past Medical History:  Past Medical History  Diagnosis Date  . MDD (major depressive disorder), single episode, severe with psychotic features 05/19/2015   History reviewed. No pertinent past surgical history. Family History:  Family History  Problem Relation Age of Onset  . Mental illness Neg Hx    Social History:  History  Alcohol Use  . 1.2 oz/week  . 2 Cans of beer per week     History  Drug Use No    History   Social History  . Marital Status: Married    Spouse Name: N/A  . Number of Children: N/A  . Years of Education: N/A   Social History Main Topics  . Smoking status: Current Every Day Smoker -- 1.00 packs/day    Types: Cigarettes  . Smokeless tobacco: Not on file  . Alcohol Use: 1.2 oz/week    2 Cans of beer per week  . Drug Use: No  . Sexual Activity: Yes     Comment: married   Other Topics Concern  . None   Social History Narrative   Additional History:    Sleep: Fair  Appetite:  Fair  Assessment:   Musculoskeletal: Strength & Muscle Tone: within normal limits Gait & Station: normal Patient leans: N/A  Psychiatric Specialty Exam: Physical Exam  Psychiatric: His speech is normal. Thought content normal. He is withdrawn. Cognition and  memory are normal. He exhibits a depressed mood.    Review of Systems  Constitutional: Negative for fever, chills, weight loss, malaise/fatigue and diaphoresis.  HENT: Negative for congestion, ear discharge, ear pain, hearing loss, nosebleeds, sore throat and tinnitus.   Eyes: Negative for blurred vision, double vision, photophobia, pain, discharge and redness.  Respiratory: Negative for cough, hemoptysis, sputum production, shortness of breath, wheezing and stridor.    Cardiovascular: Negative for chest pain, palpitations, orthopnea, claudication and leg swelling.  Gastrointestinal: Negative for heartburn, nausea, vomiting, abdominal pain, diarrhea, constipation, blood in stool and melena.  Genitourinary: Negative for dysuria, urgency, frequency, hematuria and flank pain.  Musculoskeletal: Negative for myalgias, back pain, joint pain, falls and neck pain.  Skin: Negative for itching and rash.  Neurological: Negative for dizziness, tingling, tremors, sensory change, speech change, focal weakness, seizures, loss of consciousness, weakness and headaches.  Endo/Heme/Allergies: Negative for environmental allergies and polydipsia. Does not bruise/bleed easily.  Psychiatric/Behavioral: Positive for depression. Negative for suicidal ideas, hallucinations, memory loss and substance abuse. The patient is nervous/anxious. The patient does not have insomnia.     Blood pressure 112/75, pulse 90, temperature 98.8 F (37.1 C), temperature source Oral, resp. rate 17, height '5\' 8"'  (1.727 m), weight 71.668 kg (158 lb).Body mass index is 24.03 kg/(m^2).  General Appearance: Casual  Eye Contact::  Poor  Speech:  Slow  Volume:  Decreased  Mood:  Depressed  Affect:  Constricted  Thought Process:  Coherent  Orientation:  Full (Time, Place, and Person)  Thought Content:  Paranoid Ideation and Rumination  Suicidal Thoughts:  No  Homicidal Thoughts:  No  Memory:  Immediate;   Fair Recent;   Poor Remote;   Poor  Judgement:  Impaired  Insight:  Lacking  Psychomotor Activity:  Decreased  Concentration:  Fair  Recall:  [employer] of Knowledge:Fair  Language: Fair  Akathisia:  No  Handed:  Right  AIMS (if indicated):     Assets:  Physical Health Social Support Vocational/Educational  ADL's:  Intact  Cognition: WNL  Sleep:  Number of Hours: 6   Current Medications: Current Facility-Administered Medications  Medication Dose Route Frequency Provider Last Rate Last Dose   . acetaminophen (TYLENOL) tablet 650 mg  650 mg Oral Q6H PRN Harriet Butte, NP   650 mg at 05/19/15 1822  . amitriptyline (ELAVIL) tablet 25 mg  25 mg Oral QHS PRN Ursula Alert, MD      . citalopram (CELEXA) tablet 10 mg  10 mg Oral Daily Niel Hummer, NP   10 mg at 05/19/15 1700  . haloperidol (HALDOL) tablet 5 mg  5 mg Oral QHS Niel Hummer, NP   5 mg at 05/19/15 2209  . hydrOXYzine (ATARAX/VISTARIL) tablet 25 mg  25 mg Oral TID PRN Harriet Butte, NP      . nicotine polacrilex (NICORETTE) gum 2 mg  2 mg Oral PRN Ursula Alert, MD   2 mg at 05/20/15 1815  . traZODone (DESYREL) tablet 50 mg  50 mg Oral QHS PRN Kerrie Buffalo, NP        Lab Results: No results found for this or any previous visit (from the past 48 hour(s)).  Physical Findings: AIMS: Facial and Oral Movements Muscles of Facial Expression: None, normal Lips and Perioral Area: None, normal Jaw: None, normal Tongue: None, normal,Extremity Movements Upper (arms, wrists, hands, fingers): None, normal Lower (legs, knees, ankles, toes): None, normal, Trunk Movements Neck, shoulders, hips: None, normal, Overall Severity Severity  of abnormal movements (highest score from questions above): None, normal Incapacitation due to abnormal movements: None, normal Patient's awareness of abnormal movements (rate only patient's report): No Awareness, Dental Status Current problems with teeth and/or dentures?: No Does patient usually wear dentures?: No  CIWA:  CIWA-Ar Total: 0 COWS:  COWS Total Score: 0  Treatment Plan Summary: Daily contact with patient to assess and evaluate symptoms and progress in treatment and Medication management  Continue crisis management and stabilization.  Medication management:   -Continue Haldol 5 mg at hs for psychosis (refusing) -Continue Celexa 10 mg daily for affective symptoms  (refusing) -Continue Trazodone 50 mg hs prn insomnia Will offer patient medication to address psychiatric symptoms  despite continued refusals which are documented in electronic record. CSW to obtain collateral information from family. Encouraged patient to attend groups and participate in group counseling sessions and activities.  Discharge plan in progress. Possible discharge tomorrow.  Continue current treatment plan.  Address health issues: Vitals reviewed and stable.   Medical Decision Making:  New problem, with additional work up planned, Review of Psycho-Social Stressors (1), Review or order clinical lab tests (1) and Review of Medication Regimen & Side Effects (2)  DAVIS, LAURA, NP-C 05/21/2015, 5:29 PM I agree with assessment and plan Geralyn Flash A. Sabra Heck, M.D.

## 2015-05-21 NOTE — Plan of Care (Signed)
Problem: Alteration in mood; excessive anxiety as evidenced by: Goal: STG-Patient can identify triggers for anxiety Outcome: Progressing Pt did mention between his wife and them arguing and fear of failing he has a lot of anxiety  Problem: Ineffective individual coping Goal: STG: Patient will remain free from self harm Outcome: Progressing Pt safe on the unit

## 2015-05-21 NOTE — Progress Notes (Signed)
Patient ID: Gregory Graham, male   DOB: 1986-05-23, 29 y.o.   MRN: [id number] D: Patient denies SI/HI and auditory and visual hallucinations. Patient reports that he is ready to go home today. Attending groups and interacting appropriately. Refusing medications stating he doesn't need them.  A: Patient given emotional support from RN.  Patient encouraged to attend groups and unit activities. Patient encouraged to come to staff with any questions or concerns.  R: Patient remains cooperative and appropriate. Will continue to monitor patient for safety.

## 2015-05-21 NOTE — [facility] Group Notes (Signed)
[facility]   05/21/2015 4:36 PM  Participation Quality:  Engaged  Mood/Affect:  Flat  Depression Rating:  denies  Anxiety Rating:  denies  Thoughts of Suicide:  No Will you contract for safety?   NA  Current AVH:  No  Plan for Discharge/Comments:  Continues to insist he does not need meds, so is not taking them.  Presents as Personal assistanta-symptomatic.  Is soft spoken and minimal interaction, but not necessarily dysfunctional.  Hopes to get out soon and said his mother and wife have been visiting daily.  They will be here at 2, and he asked that I talk to them then.  Transportation Means:   Supports:  Daryel GeraldNorth, Crystina Borrayo B

## 2015-05-22 NOTE — BHH Suicide Risk Assessment (Signed)
[facility] Discharge Suicide Risk Assessment   Demographic Factors:  Male  Total Time spent with patient: 30 minutes  Musculoskeletal: Strength & Muscle Tone: within normal limits Gait & Station: normal Patient leans: normal  Psychiatric Specialty Exam: Physical Exam  Review of Systems  Constitutional: Negative.   HENT: Negative.   Eyes: Negative.   Respiratory: Negative.   Cardiovascular: Negative.   Gastrointestinal: Negative.   Genitourinary: Negative.   Musculoskeletal: Negative.   Skin: Negative.   Neurological: Negative.   Endo/Heme/Allergies: Negative.   Psychiatric/Behavioral: Positive for depression.    Blood pressure 126/72, pulse 109, temperature 97.6 F (36.4 C), temperature source Oral, resp. rate 18, height  (1.727 m), weight 71.668 kg (158 lb).Body mass index is 24.03 kg/(m^2).  General Appearance: Fairly Groomed  Patent attorney::  Fair  Speech:  Clear and Coherent409  Volume:  Normal  Mood:  Euthymic  Affect:  Restricted  Thought Process:  Coherent and Goal Directed  Orientation:  Full (Time, Place, and Person)  Thought Content:  plans as he moves on  Suicidal Thoughts:  No  Homicidal Thoughts:  No  Memory:  Immediate;   Fair Recent;   Fair Remote;   Fair  Judgement:  Fair  Insight:  Present  Psychomotor Activity:  Normal  Concentration:  Fair  Recall:  [employer] of Knowledge:Fair  Language: Fair  Akathisia:  No  Handed:  Right  AIMS (if indicated):     Assets:  Desire for Improvement Housing Social Support  Sleep:  Number of Hours: 6.25  Cognition: WNL  ADL's:  Intact   Have you used any form of tobacco in the last 30 days? (Cigarettes, Smokeless Tobacco, Cigars, and/or Pipes): No  Has this patient used any form of tobacco in the last 30 days? (Cigarettes, Smokeless Tobacco, Cigars, and/or Pipes) No  Mental Status Per Nursing Assessment::   On Admission:  NA  Current Mental Status by Physician: In full contact with reality. There are no  active SI plans or intent. Mood is "feeling better" states he has learned not to make about him when people at work talk. States that he is quiet usually stays to himself and he thinks some people look at him strangely. But states he thinks he will better able to handle it now.   Loss Factors: NA  Historical Factors: NA  Risk Reduction Factors:   Sense of responsibility to family, Employed, Living with another person, especially a relative and Positive social support  Continued Clinical Symptoms:  Depression:   Severe  Cognitive Features That Contribute To Risk:  Closed-mindedness, Polarized thinking and Thought constriction (tunnel vision)    Suicide Risk:  Minimal: No identifiable suicidal ideation.  Patients presenting with no risk factors but with morbid ruminations; may be classified as minimal risk based on the severity of the depressive symptoms  Principal Problem: MDD (major depressive disorder), single episode, severe with psychotic features Discharge Diagnoses:  Patient Active Problem List   Diagnosis Date Noted  . MDD (major depressive disorder), single episode, severe with psychotic features [F32.3] 05/19/2015  . Psychosis [F29] 05/18/2015  . Anxiety [F41.9] 05/18/2015  . Cannabis use disorder, severe, in early remission [F12.90] 05/18/2015  . Tobacco use disorder [Z72.0] 05/18/2015    Follow-up Information    Follow up with [facility].   Specialty:  Behavioral Health   Why:  Please walk-in between 8am-3pm Monday-Friday to be set up with a doctor and a therapist.   Contact information:   [address] [place] [address] [phone]       Plan Of Care/Follow-up recommendations:  Activity:  as tolerated Diet:  regular Follow up [facility] Is patient on multiple antipsychotic therapies at discharge:  No   Has Patient had three or more failed trials of antipsychotic monotherapy by history:  No  Recommended Plan for Multiple Antipsychotic  Therapies: NA    Erhardt Dada A 05/22/2015, 11:07 AM

## 2015-05-22 NOTE — BHH Suicide Risk Assessment (Signed)
[facility] INPATIENT:  Family/Significant Other Suicide Prevention Education  Suicide Prevention Education:  Education Completed; No one has been identified by the patient as the family member/significant other with whom the patient will be residing, and identified as the person(s) who will aid the patient in the event of a mental health crisis (suicidal ideations/suicide attempt).  With written consent from the patient, the family member/significant other has been provided the following suicide prevention education, prior to the and/or following the discharge of the patient.  The suicide prevention education provided includes the following:  Suicide risk factors  Suicide prevention and interventions  National Suicide Hotline telephone number  [facility] assessment telephone number  [facility] 911  [facility] and/or [facility] telephone number  Request made of family/significant other to:  Remove weapons (e.g., guns, rifles, knives), all items previously/currently identified as safety concern.    Remove drugs/medications (over-the-counter, prescriptions, illicit drugs), all items previously/currently identified as a safety concern.  The family member/significant other verbalizes understanding of the suicide prevention education information provided.  The family member/significant other agrees to remove the items of safety concern listed above. The patient did not endorse SI at the time of admission, nor did the patient c/o SI during the stay here.  SPE not required.  However, pt was c/o command hallucinations prior to admission.  Spoke with both wife and mother who confirmed that pt is doing well, that they are not concerned about safety, and that they are ready to receive him home.  Ms Chestine SporeClark, mother, [phone].  Daryel Geraldorth, Amel Kitch B 05/22/2015, 10:27 AM

## 2015-05-22 NOTE — Progress Notes (Signed)
Approached patient and introduced self. Patient denying any issues this morning. "I'm not depressed or anxious." "I've never been on meds and I don't need them now." Patient restless, preoccupied with anxious, worried affect. Expressing desire to discharge. Patient offered support, reassurance. Discussed patient's care with CM who reports patient is discharging today. Patient denies SI/HI/AVH. Will await discharge order. Lawrence MarseillesFriedman, Luciana Cammarata Eakes

## 2015-05-22 NOTE — [facility] Group Notes (Signed)
[facility] Group Notes:  (Nursing/MHT/Case Management/Adjunct)  Date:  05/22/2015  Time:  0900  Type of Therapy:  Nurse Education  Participation Level:  Minimal  Participation Quality:  Appropriate  Affect:  Blunted  Cognitive:  Alert  Insight:  Lacking  Engagement in Group:  Limited  Modes of Intervention:  Discussion, Education and Support  Summary of Progress/Problems: Discussed importance of sleep hygiene and reviewed Tuesday workbook.   Merian CapronFriedman, Doral Ventrella [facility] 05/22/2015, 0930

## 2015-05-22 NOTE — Tx Team (Signed)
Interdisciplinary Treatment Plan Update (Adult)  Date:  05/22/2015   Time Reviewed:  10:23 AM   Progress in Treatment: Attending groups: Yes. Participating in groups:  Yes. Taking medication as prescribed:  Yes. Tolerating medication:  Yes. Family/Significant othe contact made:  Yes Patient understands diagnosis:  Yes   Discussing patient identified problems/goals with staff:  Yes, see initial care plan. Medical problems stabilized or resolved:  Yes. Denies suicidal/homicidal ideation: Yes. Issues/concerns per patient self-inventory:  No. Other:  New problem(s) identified:  Discharge Plan or Barriers: return home, follow up outpt  Reason for Continuation of Hospitalization:   Comments:  Estimated length of stay: D/C today  New goal(s):  Review of initial/current patient goals per problem list:     Attendees: Patient:  05/22/2015 10:23 AM   Family:   05/22/2015 10:23 AM   Physician:  Jomarie LongsSaramma Eappen, MD 05/22/2015 10:23 AM   Nursing:   Orlie PollenMarion Friedman, RN 05/22/2015 10:23 AM   CSW:    Daryel Geraldodney Corynne Scibilia, LCSW   05/22/2015 10:23 AM   Other:  05/22/2015 10:23 AM   Other:   05/22/2015 10:23 AM   Other:  Onnie BoerJennifer Goudeau, Nurse CM 05/22/2015 10:23 AM   Other:  Leisa LenzValerie Enoch, [facility] 05/22/2015 10:23 AM   Other:  Tomasita Morrowelora Sutton, P4CC  05/22/2015 10:23 AM   Other:  05/22/2015 10:23 AM   Other:  05/22/2015 10:23 AM   Other:  05/22/2015 10:23 AM   Other:  05/22/2015 10:23 AM   Other:  05/22/2015 10:23 AM   Other:   05/22/2015 10:23 AM    Scribe for Treatment Team:   Ida RogueNorth, Rishon Thilges B, 05/22/2015 10:23 AM

## 2015-05-22 NOTE — Discharge Summary (Signed)
Physician Discharge Summary Note  Patient:  Gregory Graham is an 29 y.o., male MRN:  [id number] DOB:  03/03/1986 Patient phone:  [phone] (home)  Patient address:   [address] [place] [address],  Total Time spent with patient: 30 minutes  Date of Admission:  05/17/2015 Date of Discharge: 05/22/15  Reason for Admission:  Depressive symptoms, hearing voices that were possibly command to harm wife  Principal Problem: MDD (major depressive disorder), single episode, severe with psychotic features Discharge Diagnoses: Patient Active Problem List   Diagnosis Date Noted  . MDD (major depressive disorder), single episode, severe with psychotic features [F32.3] 05/19/2015  . Psychosis [F29] 05/18/2015  . Anxiety [F41.9] 05/18/2015  . Cannabis use disorder, severe, in early remission [F12.90] 05/18/2015  . Tobacco use disorder [Z72.0] 05/18/2015   Musculoskeletal: Strength & Muscle Tone: within normal limits Gait & Station: normal Patient leans: N/A  Psychiatric Specialty Exam: Physical Exam  Psychiatric: Gregory Graham has a normal mood and affect. His speech is normal and behavior is normal. Judgment and thought content normal. Cognition and memory are normal.    Review of Systems  Constitutional: Negative.   HENT: Negative.   Eyes: Negative.   Respiratory: Negative.   Cardiovascular: Negative.   Gastrointestinal: Negative.   Genitourinary: Negative.   Musculoskeletal: Negative.   Skin: Negative.   Neurological: Negative.   Endo/Heme/Allergies: Negative.   Psychiatric/Behavioral: Negative for depression, suicidal ideas, hallucinations, memory loss and substance abuse. The patient is not nervous/anxious and does not have insomnia.     Blood pressure 126/72, pulse 109, temperature 97.6 F (36.4 C), temperature source Oral, resp. rate 18, height  (1.727 m), weight 71.668 kg (158 lb).Body mass index is 24.03 kg/(m^2).  See Physician SRA     Have you used any form of  tobacco in the last 30 days? (Cigarettes, Smokeless Tobacco, Cigars, and/or Pipes): No  Has this patient used any form of tobacco in the last 30 days? (Cigarettes, Smokeless Tobacco, Cigars, and/or Pipes) No  Past Medical History:  Past Medical History  Diagnosis Date  . MDD (major depressive disorder), single episode, severe with psychotic features 05/19/2015   History reviewed. No pertinent past surgical history. Family History:  Family History  Problem Relation Age of Onset  . Mental illness Neg Hx    Social History:  History  Alcohol Use  . 1.2 oz/week  . 2 Cans of beer per week     History  Drug Use No    History   Social History  . Marital Status: Married    Spouse Name: N/A  . Number of Children: N/A  . Years of Education: N/A   Social History Main Topics  . Smoking status: Current Every Day Smoker -- 1.00 packs/day    Types: Cigarettes  . Smokeless tobacco: Not on file  . Alcohol Use: 1.2 oz/week    2 Cans of beer per week  . Drug Use: No  . Sexual Activity: Yes     Comment: married   Other Topics Concern  . None   Social History Narrative   Risk to Self: Is patient at risk for suicide?: No What has been your use of drugs/alcohol within the last 12 months?: Pt reports occassional use of alcohol, approximately 1x a month; stopped smoking THC 3-5 months ago Risk to Others:   Prior Inpatient Therapy:   Prior Outpatient Therapy:    Level of Care:  OP  Hospital Course:   Gregory Graham is a 29 y.o.  AA male, with no significant past hx of mental illness, presented to ED with complaints of headache, chest pain and trouble sleeping. During the course of his evaluation with EDP Gregory Graham reported mood lability and AH with command. Per initial notes in EHR -" Patient reported depressed and anxious mood, and displayed flat affect. Speech - logical, coherent, soft, and slow. Insight and judgment seem partial to fair. Pt denied self-harm, SI, or current SA. Gregory Graham reported  hearing voices outside, nearly all the time, telling him to hurt or kill. Pt was vague about this. Gregory Graham denied intent to act on these voices, or intended victim. Pt did however, tell EDP that voices make comments about him hurting his wife. Pt reported that Gregory Graham started to feel depressed about a year ago, then began to have intense intermittent headaches, followed by onset of AH. Pt reported that Gregory Graham had been using THC since age 90-19 but stopped about 3-5 months ago due to increased paranoia. "         Gregory Graham was admitted to the adult 500 unit. Gregory Graham was evaluated and his symptoms were identified. Medication management was discussed and initiated. The pt was started on Celexa 10 mg daily for depression and Haldol 5 mg at bedtime for psychosis. Gregory Graham refused the medications consistently. Gregory Graham was oriented to the unit and encouraged to participate in unit programming. Medical problems were identified and treated appropriately. Home medication was restarted as needed.        The patient was evaluated each day by a clinical provider to ascertain the patient's response to treatment.  Improvement was noted by the patient's report of decreasing symptoms, improved sleep and appetite, affect, medication tolerance, behavior, and participation in unit programming.  Gregory Graham was asked each day to complete a self inventory noting mood, mental status, pain, new symptoms, anxiety and concerns. Patient became more active on the unit but continued to minimize depressive symptoms. Gregory Graham was not willing to take any psychiatric medications. The patient was fixated on leaving the hospital and not requiring any treatment. Gregory Graham reported never having been on medications before and that Gregory Graham did not need any. The social worker contacted both the patient's wife and mother prior to discharge. These two family members reported that Gregory Graham seemed to be doing well and were not concerned about safety. Gregory Graham will return to live with his wife.          Gregory Graham  responded well to being in a therapeutic and supportive environment but had refused medications that were prescribed. Positive and appropriate behavior was noted and the patient was motivated for recovery.  The patient worked closely with the treatment team and case manager to develop a discharge plan with appropriate goals. Coping skills, problem solving as well as relaxation therapies were also part of the unit programming.         By the day of discharge Gregory Graham was in much improved condition than upon admission.  Symptoms were reported as significantly decreased or resolved completely. The patient denied SI/HI and voiced no AVH. Gregory Graham was motivated to continue taking mental health treatment with a therapist with a goal of continued improvement in mental health. Gregory Graham was discharged home with a plan to follow up as noted below.  Consults:  psychiatry  Significant Diagnostic Studies:  UDS negative, Chemistry panel, CBC,   Discharge Vitals:   Blood pressure 126/72, pulse 109, temperature 97.6 F (36.4 C), temperature source Oral, resp. rate 18, height 5\' 8"  (1.727 m), weight  71.668 kg (158 lb). Body mass index is 24.03 kg/(m^2). Lab Results:   No results found for this or any previous visit (from the past 72 hour(s)).  Physical Findings: AIMS: Facial and Oral Movements Muscles of Facial Expression: None, normal Lips and Perioral Area: None, normal Jaw: None, normal Tongue: None, normal,Extremity Movements Upper (arms, wrists, hands, fingers): None, normal Lower (legs, knees, ankles, toes): None, normal, Trunk Movements Neck, shoulders, hips: None, normal, Overall Severity Severity of abnormal movements (highest score from questions above): None, normal Incapacitation due to abnormal movements: None, normal Patient's awareness of abnormal movements (rate only patient's report): No Awareness, Dental Status Current problems with teeth and/or dentures?: No Does patient usually wear  dentures?: No  CIWA:  CIWA-Ar Total: 0 COWS:  COWS Total Score: 0   See Psychiatric Specialty Exam and Suicide Risk Assessment completed by Attending Physician prior to discharge.  Discharge destination:  Home  Is patient on multiple antipsychotic therapies at discharge:  No   Has Patient had three or more failed trials of antipsychotic monotherapy by history:  No  Recommended Plan for Multiple Antipsychotic Therapies: NA     Medication List    Notice    You have not been prescribed any medications.     Follow-up Information    [facility]   Specialty:  Behavioral Health   Why:  Please walk-in between 8am-3pm Monday-Friday to be set up with a doctor and a therapist.   Contact information:   [address] [place] [address] [phone]       Follow-up recommendations:   Activity: as tolerated Diet: regular Follow up [facility]  Comments:   Take all your medications as prescribed by your mental healthcare provider.  Report any adverse effects and or reactions from your medicines to your outpatient provider promptly.  Patient is instructed and cautioned to not engage in alcohol and or illegal drug use while on prescription medicines.  In the event of worsening symptoms, patient is instructed to call the crisis hotline, 911 and or go to the nearest ED for appropriate evaluation and treatment of symptoms.  Follow-up with your primary care provider for your other medical issues, concerns and or health care needs.   Total Discharge Time: Greater than 30 minutes  Signed: DAVIS, LAURA NP-C 05/22/2015, 5:53 PM  I personally assessed the patient and formulated the plan Madie RenoIrving A. Dub MikesLugo, M.D.

## 2015-05-22 NOTE — Progress Notes (Signed)
Patient packed and ready for discharge. Teaching explained. Patient has not been on medication therefore no Rx's or sample meds needed. No belongings stored. Patient denies SI/HI and is discharged in stable condition to wife. Gregory Graham, [name]

## 2015-05-22 NOTE — Progress Notes (Signed)
  [facility] Discharge Plan :  Will you be returning to the same living situation after discharge:  Yes,  home At discharge, do you have transportation home?: Yes,  family Do you have the ability to pay for your medications: Yes,  not taking any  Release of information consent forms completed and in the chart;  Patient's signature needed at discharge.  Patient to Follow up at: Follow-up Information    [facility]   Specialty:  Behavioral Health   Why:  Please walk-in between 8am-3pm Monday-Friday to be set up with a doctor and a therapist.   Contact information:   [address] [place] [address] [phone]       Patient denies SI/HI: Yes,  yes    Safety Planning and Suicide Prevention discussed: Yes,  yes  Have you used any form of tobacco in the last 30 days? (Cigarettes, Smokeless Tobacco, Cigars, and/or Pipes): No  Has patient been referred to the Quitline?: N/A patient is not a smoker  Graham Graham DistanceRodney Graham 05/22/2015, 10:25 AM

## 2015-07-04 ENCOUNTER — Encounter (HOSPITAL_COMMUNITY): Payer: Self-pay | Admitting: Emergency Medicine

## 2015-07-04 ENCOUNTER — Emergency Department (HOSPITAL_COMMUNITY)
Admission: EM | Admit: 2015-07-04 | Discharge: 2015-07-05 | Disposition: A | Discharge status: Other Acute Inpatient Hospital | Payer: Federal, State, Local not specified - Other | Attending: Emergency Medicine | Admitting: Emergency Medicine

## 2015-07-04 DIAGNOSIS — F419 Anxiety disorder, unspecified: Secondary | ICD-10-CM | POA: Diagnosis present

## 2015-07-04 DIAGNOSIS — F200 Paranoid schizophrenia: Secondary | ICD-10-CM | POA: Insufficient documentation

## 2015-07-04 DIAGNOSIS — F29 Unspecified psychosis not due to a substance or known physiological condition: Secondary | ICD-10-CM | POA: Diagnosis present

## 2015-07-04 DIAGNOSIS — F329 Major depressive disorder, single episode, unspecified: Secondary | ICD-10-CM | POA: Insufficient documentation

## 2015-07-04 DIAGNOSIS — F323 Major depressive disorder, single episode, severe with psychotic features: Secondary | ICD-10-CM | POA: Diagnosis present

## 2015-07-04 DIAGNOSIS — Z720 Tobacco use: Secondary | ICD-10-CM | POA: Insufficient documentation

## 2015-07-04 DIAGNOSIS — R440 Auditory hallucinations: Secondary | ICD-10-CM

## 2015-07-04 LAB — RAPID URINE DRUG SCREEN, HOSP PERFORMED
AMPHETAMINES: NOT DETECTED
Barbiturates: NOT DETECTED
Benzodiazepines: NOT DETECTED
Cocaine: NOT DETECTED
Opiates: NOT DETECTED
Tetrahydrocannabinol: NOT DETECTED

## 2015-07-04 LAB — COMPREHENSIVE METABOLIC PANEL
ALT: 17 U/L (ref 17–63)
ANION GAP: 7 (ref 5–15)
AST: 34 U/L (ref 15–41)
Albumin: 3.6 g/dL (ref 3.5–5.0)
Alkaline Phosphatase: 49 U/L (ref 38–126)
BUN: 12 mg/dL (ref 6–20)
CHLORIDE: 103 mmol/L (ref 101–111)
CO2: 30 mmol/L (ref 22–32)
CREATININE: 1.09 mg/dL (ref 0.61–1.24)
Calcium: 8.7 mg/dL — ABNORMAL LOW (ref 8.9–10.3)
GFR calc non Af Amer: >60 mL/min (ref >60)
Glucose, Bld: 105 mg/dL — ABNORMAL HIGH (ref 65–99)
POTASSIUM: 3.1 mmol/L — AB (ref 3.5–5.1)
SODIUM: 140 mmol/L (ref 135–145)
Total Bilirubin: 0.3 mg/dL (ref 0.3–1.2)
Total Protein: 6.5 g/dL (ref 6.5–8.1)

## 2015-07-04 LAB — CBC WITH DIFFERENTIAL/PLATELET
Basophils Absolute: 0 10*3/uL (ref 0.0–0.1)
Basophils Relative: 1 % (ref 0–1)
EOS ABS: 0.2 10*3/uL (ref 0.0–0.7)
Eosinophils Relative: 3 % (ref 0–5)
HCT: 38.3 % — ABNORMAL LOW (ref 39.0–52.0)
Hemoglobin: 12.6 g/dL — ABNORMAL LOW (ref 13.0–17.0)
Lymphocytes Relative: 40 % (ref 12–46)
Lymphs Abs: 2.7 10*3/uL (ref 0.7–4.0)
MCH: 27 pg (ref 26.0–34.0)
MCHC: 32.9 g/dL (ref 30.0–36.0)
MCV: 82.2 fL (ref 78.0–100.0)
MONO ABS: 0.4 10*3/uL (ref 0.1–1.0)
Monocytes Relative: 6 % (ref 3–12)
Neutro Abs: 3.4 10*3/uL (ref 1.7–7.7)
Neutrophils Relative %: 50 % (ref 43–77)
Platelets: 253 10*3/uL (ref 150–400)
RBC: 4.66 MIL/uL (ref 4.22–5.81)
RDW: 14.2 % (ref 11.5–15.5)
WBC: 6.8 10*3/uL (ref 4.0–10.5)

## 2015-07-04 LAB — ETHANOL

## 2015-07-04 MED ORDER — ARIPIPRAZOLE 2 MG PO TABS
2.0000 mg | ORAL_TABLET | Freq: Two times a day (BID) | ORAL | Status: DC
  Administered 2015-07-04 – 2015-07-05 (×2): 2 mg via ORAL
  Filled 2015-07-04 (×2): qty 1

## 2015-07-04 MED ORDER — ONDANSETRON HCL 4 MG PO TABS: 4.0000 mg | ORAL_TABLET | Freq: Three times a day (TID) | ORAL | Status: DC | PRN

## 2015-07-04 MED ORDER — ZOLPIDEM TARTRATE 5 MG PO TABS: 5.0000 mg | ORAL_TABLET | Freq: Every evening | ORAL | Status: DC | PRN

## 2015-07-04 MED ORDER — ACETAMINOPHEN 325 MG PO TABS: 650.0000 mg | ORAL_TABLET | ORAL | Status: DC | PRN

## 2015-07-04 MED ORDER — CITALOPRAM HYDROBROMIDE 10 MG PO TABS
20.0000 mg | ORAL_TABLET | Freq: Every day | ORAL | Status: DC
  Administered 2015-07-05: 20 mg via ORAL
  Filled 2015-07-04: qty 2

## 2015-07-04 NOTE — ED Notes (Signed)
Pt resting, no needs at this time; visitor asleep on chairs at bedside

## 2015-07-04 NOTE — ED Notes (Signed)
Pt. reports anxiety and depression onset this week , denies hallucinations , no suicidal or homicidal ideation .

## 2015-07-04 NOTE — ED Notes (Signed)
Pts wife's contact info Timmie Foerster [phone]

## 2015-07-04 NOTE — ED Notes (Addendum)
This note is on what the pts wife has has been hearing and seeing from the pt.   Per the pts wife on Tuesday the pt was outside yelling and the cops were called. When the cops arrived the pt was calm and cooperative. When the pt was on the phone with his wife the pt stated "when I get home I am going to kill them too" and "the evil things are going to take me over". The pts wife does not believe that the pt is remembering everything that he is going and saying. When the pts wife asks the pt what he has said or who he is talking to and the pt has been saying, "I didn't say anything, you are hearing things".  On Monday around 0300 the pt walked from [facility] to [place], this is unusual for the pt.  Since about 1 week after the pt was discharged from [facility] the pt has been talking about "an audio" in the car outside, making him want to move the car further down the street so that he can not hear it.  The pt has also been feeling like everyone around him is talking about him and his wife.

## 2015-07-04 NOTE — ED Provider Notes (Signed)
CSN: [id number]     Arrival date & time 07/04/15  7829 History   First MD Initiated Contact with Patient 07/04/15 0554     Chief Complaint  Patient presents with  . Depression  . Anxiety     (Consider location/radiation/quality/duration/timing/severity/associated sxs/prior Treatment) HPI Comments: Pt comes in with cc of hallucination and change in behavior. Wife reports that pt is not taking his meds. He has been acting erratic- walking long distance for a job interview that doesn't exist and also being paranoid about people talking about him. He also has threatened to kill his nephew and moved to his mother's place. Pt admits to not taking his meds. He denies everything else the wife talked about.  The history is provided by the patient and the spouse.    Past Medical History  Diagnosis Date  . MDD (major depressive disorder), single episode, severe with psychotic features 05/19/2015   History reviewed. No pertinent past surgical history. Family History  Problem Relation Age of Onset  . Mental illness Neg Hx    Social History  Substance Use Topics  . Smoking status: Current Every Day Smoker -- 1.00 packs/day    Types: Cigarettes  . Smokeless tobacco: None  . Alcohol Use: 1.2 oz/week    2 Cans of beer per week    Review of Systems  Constitutional: Positive for activity change. Negative for appetite change.  Respiratory: Negative for cough and shortness of breath.   Cardiovascular: Negative for chest pain.  Gastrointestinal: Negative for abdominal pain.  Genitourinary: Negative for dysuria.  Psychiatric/Behavioral: Positive for hallucinations, behavioral problems and dysphoric mood. Negative for suicidal ideas and self-injury.      Allergies  Review of patient's allergies indicates no known allergies.  Home Medications   Prior to Admission medications   Not on File   BP 114/66 mmHg  Pulse 65  Temp(Src) 97.8 F (36.6 C) (Oral)  Resp 16  SpO2 100% Physical Exam   Constitutional: He is oriented to person, place, and time. He appears well-developed.  HENT:  Head: Normocephalic and atraumatic.  Eyes: Conjunctivae and EOM are normal. Pupils are equal, round, and reactive to light.  Neck: Normal range of motion. Neck supple.  Cardiovascular: Normal rate and regular rhythm.   Pulmonary/Chest: Effort normal and breath sounds normal.  Abdominal: Soft. Bowel sounds are normal. He exhibits no distension. There is no tenderness. There is no rebound and no guarding.  Neurological: He is alert and oriented to person, place, and time.  Skin: Skin is warm.  Psychiatric:  Flat affect  Nursing note and vitals reviewed.   ED Course  Procedures (including critical care time) Labs Review Labs Reviewed  CBC WITH DIFFERENTIAL/PLATELET - Abnormal; Notable for the following:    Hemoglobin 12.6 (*)    HCT 38.3 (*)    All other components within normal limits  COMPREHENSIVE METABOLIC PANEL - Abnormal; Notable for the following:    Potassium 3.1 (*)    Glucose, Bld 105 (*)    Calcium 8.7 (*)    All other components within normal limits  URINE RAPID DRUG SCREEN, HOSP PERFORMED  ETHANOL    Imaging Review No results found. I have personally reviewed and evaluated these images and lab results as part of my medical decision-making.   EKG Interpretation None      MDM   Final diagnoses:  Paranoid schizophrenia  Auditory hallucination    Pt brought in by wife. Comes in with change in behavior and also auditory  hallcuniations. Pt has threatened to kill his nephews and nieces. Also acting paranoid - thinking everyone around his is talking about him. Pt also has complains of hearing voices. Recently walked to [employer] for a job interview - w/o actually having an interview. Psych consulted. PT has a flat affect. Denies most of the things wife reports. Medically cleared.    Derwood Kaplan, MD 07/05/15 1059

## 2015-07-04 NOTE — ED Notes (Signed)
Security at bedside

## 2015-07-04 NOTE — ED Notes (Signed)
Dr. Talbot Grumbling at bedside

## 2015-07-04 NOTE — ED Notes (Signed)
Pt states, "I don't feel like I need to be admitted somewhere else." pt informed of the recommendation post the telepsych interview, pt aware that the MD will be notified re: the pts concern

## 2015-07-04 NOTE — ED Notes (Signed)
Pt speaking with mother on phone, pt calm & cooperative

## 2015-07-04 NOTE — BH Assessment (Signed)
Tele Assessment Note   Gregory Graham is a 29 y.o. male who voluntarily presents to [facility] c/o of worsening depression and anxiety.  Pt denies SI/HI/AVH.  Pt reports stressors attributing to his depression: (1) unemployment; (2) financial; (3) being away from his wife and children.  Pt says his depression/anxiety has been getting worse for approx 24mos-11yr.  Pt is not currently in engaging in outpatient services and says he is not any psych meds.  Pt describes depressive sxs: anhedonia, poor appetite/sleep, loneliness, helplessness and hopelessness.  Pt states his wife brought him to the hospital and she provided collateral info to the medical staff: This note is on what the pts wife has has been hearing and seeing from the pt:  Per the pts wife on Tuesday the pt was outside yelling and the cops were called. When the cops arrived the pt was calm and cooperative. When the pt was on the phone with his wife the pt stated "when I get home I am going to kill them too" and "the evil things are going to take me over". The pts wife does not believe that the pt is remembering everything that he is going and saying. When the pts wife asks the pt what he has said or who he is talking to and the pt has been saying, "I didn't say anything, you are hearing things".  On Monday around 0300 the pt walked from [facility] to [place], this is unusual for the pt.  Since about 1 week after the pt was discharged from [facility] the pt has been talking about "an audio" in the car outside, making him want to move the car further down the street so that he can not hear it.  The pt has also been feeling like everyone around him is talking about him and his wife.        Axis I: Major depressive disorder, Single episode, With psychotic features;Cannabis use disorder, Severe; in early remission; Anxiety D/O    Axis II: Deferred Axis III:  Past Medical History  Diagnosis Date  . MDD (major depressive disorder), single  episode, severe with psychotic features 05/19/2015   Axis IV: other psychosocial or environmental problems, problems related to social environment and problems with primary support group Axis V: 31-40 impairment in reality testing  Past Medical History:  Past Medical History  Diagnosis Date  . MDD (major depressive disorder), single episode, severe with psychotic features 05/19/2015    History reviewed. No pertinent past surgical history.  Family History:  Family History  Problem Relation Age of Onset  . Mental illness Neg Hx     Social History:  reports that he has been smoking Cigarettes.  He has been smoking about 1.00 pack per day. He does not have any smokeless tobacco history on file. He reports that he drinks about 1.2 oz of alcohol per week. He reports that he does not use illicit drugs.  Additional Social History:  Alcohol / Drug Use Pain Medications: See MAR  Prescriptions: See MAR  Over the Counter: See MAR  History of alcohol / drug use?: Yes Longest period of sobriety (when/how long): 3-5 months for THC  Negative Consequences of Use: Work / Programmer, multimedia, Personal relationships Withdrawal Symptoms: Other (Comment) (No w/d sxs ) Substance #1 Name of Substance 1: Alcohol  1 - Age of First Use: 26 YOM  1 - Amount (size/oz): 1 shot  1 - Frequency: Monthly  1 - Duration: 2 Yrs  1 - Last  Use / Amount: Unk  Substance #2 Name of Substance 2: THC  2 - Age of First Use: Teens  2 - Amount (size/oz): Unk  2 - Frequency: Daily  2 - Duration: On-going  2 - Last Use / Amount: Recently stopped appox 3-5 months ago because he was feeling paranoid  CIWA: CIWA-Ar BP: 114/74 mmHg Pulse Rate: 78 COWS:    PATIENT STRENGTHS: (choose at least two) Supportive family/friends  Allergies: No Known Allergies  Home Medications:  (Not in a hospital admission)  OB/GYN Status:  No LMP for male patient.  General Assessment Data Location of Assessment: [facility] ED TTS Assessment: In system Is  this a Tele or Face-to-Face Assessment?: Tele Assessment Is this an Initial Assessment or a Re-assessment for this encounter?: Initial Assessment Marital status: Married Sparks name: None  Is patient pregnant?: No Pregnancy Status: No Living Arrangements: Other relatives (Currently lives with aunt ) Can pt return to current living arrangement?: Yes Admission Status: Voluntary Is patient capable of signing voluntary admission?: Yes Referral Source: MD Insurance type: SP   Medical Screening Exam [facility] Walk-in ONLY) Medical Exam completed: No Reason for MSE not completed: Other: (None )  Crisis Care Plan Living Arrangements: Other relatives (Currently lives with aunt ) Name of Psychiatrist: None  Name of Therapist: None   Education Status Is patient currently in school?: No Current Grade: None  Highest grade of school patient has completed: 29 Name of school: None  Contact person: None   Risk to self with the past 6 months Suicidal Ideation: No Has patient been a risk to self within the past 6 months prior to admission? : No Suicidal Intent: No Has patient had any suicidal intent within the past 6 months prior to admission? : No Is patient at risk for suicide?: No Suicidal Plan?: No Has patient had any suicidal plan within the past 6 months prior to admission? : No Access to Means: No What has been your use of drugs/alcohol within the last 12 months?: Pt currently denies  Previous Attempts/Gestures: No How many times?: 0 Other Self Harm Risks: None  Triggers for Past Attempts: None known Intentional Self Injurious Behavior: None Family Suicide History: No Recent stressful life event(s): Other (Comment), Financial Problems (Not living with spouse/children; unemployed ) Persecutory voices/beliefs?: No Depression: Yes Depression Symptoms: Loss of interest in usual pleasures, Feeling worthless/self pity, Despondent, Insomnia Substance abuse history and/or treatment for  substance abuse?: No Suicide prevention information given to non-admitted patients: Not applicable  Risk to Others within the past 6 months Homicidal Ideation: No Does patient have any lifetime risk of violence toward others beyond the six months prior to admission? : No Thoughts of Harm to Others: No Comment - Thoughts of Harm to Others: None  Current Homicidal Intent: No Current Homicidal Plan: No Access to Homicidal Means: No Identified Victim: None  History of harm to others?: No Assessment of Violence: None Noted Violent Behavior Description: None  Does patient have access to weapons?: No Criminal Charges Pending?: No Does patient have a court date: No Is patient on probation?: No  Psychosis Hallucinations: None noted Delusions: None noted  Mental Status Report Appearance/Hygiene: In hospital gown Eye Contact: Poor Motor Activity: Unremarkable Speech: Logical/coherent, Soft, Slow Level of Consciousness: Alert Mood: Depressed Affect: Depressed, Flat Anxiety Level: Panic Attacks Panic attack frequency: 2x's a week  Most recent panic attack: Unk  Thought Processes: Coherent, Relevant Judgement: Partial Orientation: Person, Place, Time, Situation Obsessive Compulsive Thoughts/Behaviors: None  Cognitive Functioning  Concentration: Normal Memory: Recent Intact, Remote Intact IQ: Average Insight: Poor Impulse Control: Fair Appetite: Fair Weight Loss: 0 Weight Gain: 0 Sleep: Decreased Total Hours of Sleep: 4 Vegetative Symptoms: None  ADLScreening [facility] Assessment Services) Patient's cognitive ability adequate to safely complete daily activities?: Yes Patient able to express need for assistance with ADLs?: Yes Independently performs ADLs?: Yes (appropriate for developmental age)  Prior Inpatient Therapy Prior Inpatient Therapy: Yes Prior Therapy Dates: 2016 Prior Therapy Facilty/Provider(s): [facility]  Reason for Treatment: Depression/AH  Prior Outpatient  Therapy Prior Outpatient Therapy: No Prior Therapy Dates: None  Prior Therapy Facilty/Provider(s): None  Reason for Treatment: None  Does patient have an ACCT team?: No Does patient have Intensive In-House Services?  : No Does patient have [facility] services? : No Does patient have P4CC services?: No  ADL Screening (condition at time of admission) Patient's cognitive ability adequate to safely complete daily activities?: Yes Is the patient deaf or have difficulty hearing?: No Does the patient have difficulty seeing, even when wearing glasses/contacts?: No Does the patient have difficulty concentrating, remembering, or making decisions?: Yes Patient able to express need for assistance with ADLs?: Yes Does the patient have difficulty dressing or bathing?: No Independently performs ADLs?: Yes (appropriate for developmental age) Does the patient have difficulty walking or climbing stairs?: No Weakness of Legs: None Weakness of Arms/Hands: None  Home Assistive Devices/Equipment Home Assistive Devices/Equipment: None  Therapy Consults (therapy consults require a physician order) PT Evaluation Needed: No OT Evalulation Needed: No SLP Evaluation Needed: No Abuse/Neglect Assessment (Assessment to be complete while patient is alone) Physical Abuse: Denies Verbal Abuse: Denies Sexual Abuse: Denies Exploitation of patient/patient's resources: Denies Self-Neglect: Denies Values / Beliefs Cultural Requests During Hospitalization: None Spiritual Requests During Hospitalization: None Consults Spiritual Care Consult Needed: No Social Work Consult Needed: No Merchant navy officer (For Healthcare) Does patient have an advance directive?: No Would patient like information on creating an advanced directive?: No - patient declined information Nutrition Screen- [facility] Adult/[facility]/AP Patient's home diet: Regular Has the patient recently lost weight without trying?: No Has the patient been eating poorly  because of a decreased appetite?: No Malnutrition Screening Tool Score: 0  Additional Information 1:1 In Past 12 Months?: No CIRT Risk: No Elopement Risk: No Does patient have medical clearance?: Yes     Disposition:  Disposition Initial Assessment Completed for this Encounter: Yes Disposition of Patient: Inpatient treatment program, Referred to (Per Donell Sievert, PA meets criteria for inpt admission ) Type of inpatient treatment program: Adult Patient referred to: Other (Comment) (Per Donell Sievert, PA meets criteria for inpt admission )  Murrell Redden 07/04/2015 6:54 AM

## 2015-07-04 NOTE — Progress Notes (Addendum)
Patient referred at: [facility] - per Victorino Dike, a few beds available, fax referral. [facility] - per Natalia Leatherwood, fax referral, 1 bed open. [facility] - per intake, fax referral for wait-list [facility] - per Rosey Bath, fax referral for review. [facility] - per Britta Mccreedy, fax it. [facility] - per intake, fax referral for review.  [facility] - unable to reach intake, left voicemail.  At capacity: [facility]   CSW will continue to seek placement.  Melbourne Abts, LCSWA Disposition staff 07/04/2015 7:16 PM

## 2015-07-04 NOTE — ED Notes (Signed)
Pt has been changed into wine scrubs, belongings have been removed from the room, and security has been notified that the pt needs to be wanded. The pts wife will be taking the pts belongings home with her.

## 2015-07-04 NOTE — ED Notes (Signed)
Pt states that he has been having "anxiety attacks and headaches". Pt states that his last "anxiety attack" was Monday.

## 2015-07-04 NOTE — ED Notes (Signed)
Declines nicotine patch  
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
## 2015-07-04 NOTE — Consult Note (Signed)
[facility] Consult   Reason for Consult:  Paranoid delusions and anxiety Referring Physician:  Dr. Kathrynn Humble Patient Identification: Gregory Graham MRN:  [id number] Principal Diagnosis: MDD (major depressive disorder), single episode, severe with psychotic features Diagnosis:   Patient Active Problem List   Diagnosis Date Noted  . MDD (major depressive disorder), single episode, severe with psychotic features [F32.3] 05/19/2015  . Psychosis [F29] 05/18/2015  . Anxiety [F41.9] 05/18/2015  . Cannabis use disorder, severe, in early remission [F12.90] 05/18/2015  . Tobacco use disorder [Z72.0] 05/18/2015    Total Time spent with patient: 1 hour  Subjective:   Gregory Graham is a 29 y.o. male patient admitted with depression and anxiety.  HPI:  Gregory Graham is a 29 y.o. male seen, chart reviewed and case discussed with psychiatric LCSW. Patient is a poor historian and currently complaining headache, anxiety, depression over 6 months to one year. 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 Patient could not find any job since then and continue trying to find a new work. Patient went to [facility] for an interview but did not get it so he was disappointed and reportedly has no money walk to the [place]. Reportedly patient wife Gregory Graham informed to the staff RN in the emergency department and also to the LCSW that patient has been suffering with significant symptoms of depression and also psychosis. Patient was acting bizarre, anhedonia disturbance of sleep and appetite feeling lonely helpless and hopelessness. Patient denied drug of abuse but endorses smoking 1 pack of cigarettes daily and he tested the first time drank a beer to feel better. Patient says his depression/anxiety has been getting worse for approx 14mo-69yr.[facility] Patient is willing to take medication management for depression and anxiety at this time. Patient denies psychotic symptoms but he was observed by several family members being bizarre, or behaviors, talking to himself and possibly responding to the internal stimuli. Patient also felt like hitting voices from car audio. Please review social service note for more details. Patient wife reported that patient has been walking away from home and required missing persons report at police station. Reportedly patient has not used synthetic marijuana, even though he smoked marijuana sometime ago.  HPI Elements:   Location:  Depression and psychosis. Quality:  Poor. Severity:  Unable to function both at home and work. Timing:  Noncompliant. Duration:  Few weeks to months. Context:  Psychosocial stressors, loss of job, financial difficulties and stressed out.  Past Medical History:  Past Medical History  Diagnosis Date  . MDD (major depressive disorder), single episode, severe with psychotic features 05/19/2015   History reviewed. No pertinent past surgical history. Family History:  Family History  Problem Relation Age of Onset  . Mental illness Neg Hx    Social History:  History  Alcohol Use  . 1.2 oz/week  . 2 Cans of beer per week     History  Drug Use No    Social History   Social History  . Marital Status: Married    Spouse Name: N/A  . Number of Children: N/A  . Years of Education: N/A   Social History Main Topics  . Smoking status: Current Every Day Smoker -- 1.00 packs/day    Types: Cigarettes  . Smokeless tobacco: None  .  Alcohol Use: 1.2 oz/week    2 Cans of beer per week  . Drug Use: No  . Sexual Activity: Yes     Comment: married   Other Topics Concern  . None   Social History Narrative   Additional Social History:    Pain Medications: See MAR  Prescriptions: See MAR  Over the Counter:  See MAR  History of alcohol / drug use?: Yes Longest period of sobriety (when/how long): 3-5 months for THC  Negative Consequences of Use: Work / Youth worker, Personal relationships Withdrawal Symptoms: Other (Comment) (No w/d sxs ) Name of Substance 1: Alcohol  1 - Age of First Use: 26 YOM  1 - Amount (size/oz): 1 shot  1 - Frequency: Monthly  1 - Duration: 2 Yrs  1 - Last Use / Amount: Unk  Name of Substance 2: THC  2 - Age of First Use: Teens  2 - Amount (size/oz): Unk  2 - Frequency: Daily  2 - Duration: On-going  2 - Last Use / Amount: Recently stopped appox 3-5 months ago because he was feeling paranoid                 Allergies:  No Known Allergies  Labs:  Results for orders placed or performed during the hospital encounter of 07/04/15 (from the past 48 hour(s))  Urine rapid drug screen (hosp performed)     Status: None   Collection Time: 07/04/15  2:48 AM  Result Value Ref Range   Opiates NONE DETECTED NONE DETECTED   Cocaine NONE DETECTED NONE DETECTED   Benzodiazepines NONE DETECTED NONE DETECTED   Amphetamines NONE DETECTED NONE DETECTED   Tetrahydrocannabinol NONE DETECTED NONE DETECTED   Barbiturates NONE DETECTED NONE DETECTED    Comment:        DRUG SCREEN FOR MEDICAL PURPOSES ONLY.  IF CONFIRMATION IS NEEDED FOR ANY PURPOSE, NOTIFY LAB WITHIN 5 DAYS.        LOWEST DETECTABLE LIMITS FOR URINE DRUG SCREEN Drug Class       Cutoff (ng/mL) Amphetamine      1000 Barbiturate      200 Benzodiazepine   007 Tricyclics       622 Opiates          300 Cocaine          300 THC              50   Ethanol     Status: None   Collection Time: 07/04/15  2:48 AM  Result Value Ref Range   Alcohol, Ethyl (B) <5 <5 mg/dL    Comment:        LOWEST DETECTABLE LIMIT FOR SERUM ALCOHOL IS 5 mg/dL FOR MEDICAL PURPOSES ONLY   CBC with Differential     Status: Abnormal   Collection Time: 07/04/15  2:48 AM  Result Value Ref Range   WBC 6.8 4.0 - 10.5 K/uL   RBC 4.66 4.22  - 5.81 MIL/uL   Hemoglobin 12.6 (L) 13.0 - 17.0 g/dL   HCT 38.3 (L) 39.0 - 52.0 %   MCV 82.2 78.0 - 100.0 fL   MCH 27.0 26.0 - 34.0 pg   MCHC 32.9 30.0 - 36.0 g/dL   RDW 14.2 11.5 - 15.5 %   Platelets 253 150 - 400 K/uL   Neutrophils Relative % 50 43 - 77 %   Neutro Abs 3.4 1.7 - 7.7 K/uL   Lymphocytes Relative 40 12 - 46 %   Lymphs Abs  2.7 0.7 - 4.0 K/uL   Monocytes Relative 6 3 - 12 %   Monocytes Absolute 0.4 0.1 - 1.0 K/uL   Eosinophils Relative 3 0 - 5 %   Eosinophils Absolute 0.2 0.0 - 0.7 K/uL   Basophils Relative 1 0 - 1 %   Basophils Absolute 0.0 0.0 - 0.1 K/uL  Comprehensive metabolic panel     Status: Abnormal   Collection Time: 07/04/15  2:48 AM  Result Value Ref Range   Sodium 140 135 - 145 mmol/L   Potassium 3.1 (L) 3.5 - 5.1 mmol/L   Chloride 103 101 - 111 mmol/L   CO2 30 22 - 32 mmol/L   Glucose, Bld 105 (H) 65 - 99 mg/dL   BUN 12 6 - 20 mg/dL   Creatinine, Ser 1.09 0.61 - 1.24 mg/dL   Calcium 8.7 (L) 8.9 - 10.3 mg/dL   Total Protein 6.5 6.5 - 8.1 g/dL   Albumin 3.6 3.5 - 5.0 g/dL   AST 34 15 - 41 U/L   ALT 17 17 - 63 U/L   Alkaline Phosphatase 49 38 - 126 U/L   Total Bilirubin 0.3 0.3 - 1.2 mg/dL   GFR calc non Af Amer >60 >60 mL/min   GFR calc Af Amer >60 >60 mL/min    Comment: (NOTE) The eGFR has been calculated using the CKD EPI equation. This calculation has not been validated in all clinical situations. eGFR's persistently <60 mL/min signify possible Chronic Kidney Disease.    Anion gap 7 5 - 15    Vitals: Blood pressure 111/65, pulse 84, temperature 98.3 F (36.8 C), temperature source Oral, resp. rate 18, SpO2 98 %.  Risk to Self: Suicidal Ideation: No Suicidal Intent: No Is patient at risk for suicide?: No Suicidal Plan?: No Access to Means: No What has been your use of drugs/alcohol within the last 12 months?: Pt currently denies  How many times?: 0 Other Self Harm Risks: None  Triggers for Past Attempts: None known Intentional Self  Injurious Behavior: None Risk to Others: Homicidal Ideation: No Thoughts of Harm to Others: No Comment - Thoughts of Harm to Others: None  Current Homicidal Intent: No Current Homicidal Plan: No Access to Homicidal Means: No Identified Victim: None  History of harm to others?: No Assessment of Violence: None Noted Violent Behavior Description: None  Does patient have access to weapons?: No Criminal Charges Pending?: No Does patient have a court date: No Prior Inpatient Therapy: Prior Inpatient Therapy: Yes Prior Therapy Dates: 2016 Prior Therapy Facilty/Provider(s): [facility]  Reason for Treatment: Depression/AH Prior Outpatient Therapy: Prior Outpatient Therapy: No Prior Therapy Dates: None  Prior Therapy Facilty/Provider(s): None  Reason for Treatment: None  Does patient have an ACCT team?: No Does patient have Intensive In-House Services?  : No Does patient have [facility] services? : No Does patient have P4CC services?: No  Current Facility-Administered Medications  Medication Dose Route Frequency Provider Last Rate Last Dose  . acetaminophen (TYLENOL) tablet 650 mg  650 mg Oral Q4H PRN Ankit Nanavati, MD      . ondansetron (ZOFRAN) tablet 4 mg  4 mg Oral Q8H PRN Ankit Nanavati, MD      . zolpidem (AMBIEN) tablet 5 mg  5 mg Oral QHS PRN Varney Biles, MD       No current outpatient prescriptions on file.    Musculoskeletal: Strength & Muscle Tone: decreased Gait & Station: unable to stand Patient leans: N/A  Psychiatric Specialty Exam: Physical Exam Full physical  performed in Emergency Department. I have reviewed this assessment and concur with its findings.   ROS headache, generalized weakness, tired, anxious denied nausea, vomiting, abdominal pain, shortness of breath and chest pain No Fever-chills, No Headache, No changes with Vision or hearing, reports vertigo No problems swallowing food or Liquids, No Chest pain, Cough or Shortness of Breath, No Abdominal pain, No  Nausea or Vommitting, Bowel movements are regular, No Blood in stool or Urine, No dysuria, No new skin rashes or bruises, No new joints pains-aches,  No new weakness, tingling, numbness in any extremity, No recent weight gain or loss, No polyuria, polydypsia or polyphagia,   A full 10 point Review of Systems was done, except as stated above, all other Review of Systems were negative.  Blood pressure 111/65, pulse 84, temperature 98.3 F (36.8 C), temperature source Oral, resp. rate 18, SpO2 98 %.There is no weight on file to calculate BMI.  General Appearance: Bizarre, Disheveled and Guarded  Eye Contact::  Fair  Speech:  Clear and Coherent and Slow  Volume:  Decreased  Mood:  Anxious, Depressed and Worthless  Affect:  Constricted and Depressed  Thought Process:  Goal Directed and Irrelevant  Orientation:  Full (Time, Place, and Person)  Thought Content:  Delusions, Hallucinations: Auditory, Paranoid Ideation and Rumination  Suicidal Thoughts:  No  Homicidal Thoughts:  No  Memory:  Immediate;   Fair Recent;   Poor  Judgement:  Impaired  Insight:  Shallow  Psychomotor Activity:  Psychomotor Retardation  Concentration:  Fair  Recall:  [place] of Knowledge:Fair  Language: Good  Akathisia:  Negative  Handed:  Right  AIMS (if indicated):     Assets:  Communication Skills Desire for Improvement Housing Intimacy Leisure Time Physical Health Resilience Social Support Talents/Skills Transportation  ADL's:  Impaired  Cognition: Impaired,  Mild  Sleep:      Medical Decision Making: New problem, with additional work up planned, Review of Psycho-Social Stressors (1), Review or order clinical lab tests (1), Established Problem, Worsening (2), Review of Last Therapy Session (1), Review or order medicine tests (1), Review of Medication Regimen & Side Effects (2) and Review of New Medication or Change in Dosage (2)  Treatment Plan Summary: Patient seen with the emotionally  distressed, anxious, depressed decreased energy, motivation, with the psychotic symptoms like delusional thinking and auditory hallucinations. He has a bizarre behavior like walking 6 hours from [facility] and repeatedly leaving home. Daily contact with patient to assess and evaluate symptoms and progress in treatment and Medication management  Plan: Spoke with Mrs. Claiborne Billings, behavioral [place], no male beds available at this time for inpatient hospitalization Case discussed with Vidal Schwalbe, LCSW at [facility] emergency department Appreciated collateral information from his wife Start Celexa 10 mg daily Start Abilify 2 mg twice daily for depression with psychosis Recommend psychiatric Inpatient admission when medically cleared. Supportive therapy provided about ongoing stressors.  Appreciate psychiatric consultation and follow up as clinically required Please contact [phone] or [phone] if needs further assistance  Disposition: Patient benefit from acute psychiatric hospitalization as he is not able to function both at home and work and seems to be extremely distressed  Riad Wagley,JANARDHAHA R. 07/04/2015 1:19 PM

## 2015-07-04 NOTE — ED Notes (Signed)
Lunch tray ordered 

## 2015-07-05 ENCOUNTER — Encounter (HOSPITAL_COMMUNITY): Payer: Self-pay | Admitting: *Deleted

## 2015-07-05 ENCOUNTER — Inpatient Hospital Stay (HOSPITAL_COMMUNITY)
Admission: AD | Admit: 2015-07-05 | Discharge: 2015-07-10 | DRG: 885 | Disposition: A | Discharge status: Home / Self Care | Payer: Federal, State, Local not specified - Other | Source: Intra-hospital | Attending: Psychiatry | Admitting: Psychiatry

## 2015-07-05 DIAGNOSIS — F419 Anxiety disorder, unspecified: Secondary | ICD-10-CM | POA: Diagnosis present

## 2015-07-05 DIAGNOSIS — E876 Hypokalemia: Secondary | ICD-10-CM | POA: Diagnosis present

## 2015-07-05 DIAGNOSIS — F411 Generalized anxiety disorder: Secondary | ICD-10-CM | POA: Diagnosis not present

## 2015-07-05 DIAGNOSIS — F17200 Nicotine dependence, unspecified, uncomplicated: Secondary | ICD-10-CM | POA: Diagnosis present

## 2015-07-05 DIAGNOSIS — F29 Unspecified psychosis not due to a substance or known physiological condition: Secondary | ICD-10-CM | POA: Diagnosis present

## 2015-07-05 DIAGNOSIS — G4700 Insomnia, unspecified: Secondary | ICD-10-CM | POA: Diagnosis present

## 2015-07-05 DIAGNOSIS — F332 Major depressive disorder, recurrent severe without psychotic features: Principal | ICD-10-CM | POA: Diagnosis present

## 2015-07-05 DIAGNOSIS — F1221 Cannabis dependence, in remission: Secondary | ICD-10-CM | POA: Diagnosis present

## 2015-07-05 DIAGNOSIS — F1290 Cannabis use, unspecified, uncomplicated: Secondary | ICD-10-CM | POA: Diagnosis not present

## 2015-07-05 DIAGNOSIS — F17210 Nicotine dependence, cigarettes, uncomplicated: Secondary | ICD-10-CM | POA: Diagnosis present

## 2015-07-05 MED ORDER — ACETAMINOPHEN 325 MG PO TABS
650.0000 mg | ORAL_TABLET | Freq: Four times a day (QID) | ORAL | Status: DC | PRN
Start: 2015-07-05 — End: 2015-07-10

## 2015-07-05 MED ORDER — MAGNESIUM HYDROXIDE 400 MG/5ML PO SUSP: 30.0000 mL | Freq: Every day | ORAL | Status: DC | PRN

## 2015-07-05 MED ORDER — TRAZODONE HCL 50 MG PO TABS
50.0000 mg | ORAL_TABLET | Freq: Every evening | ORAL | Status: DC | PRN
  Filled 2015-07-05: qty 14

## 2015-07-05 MED ORDER — ALUM & MAG HYDROXIDE-SIMETH 200-200-20 MG/5ML PO SUSP: 30.0000 mL | ORAL | Status: DC | PRN

## 2015-07-05 NOTE — ED Notes (Signed)
[employer] transportation called. 

## 2015-07-05 NOTE — ED Notes (Signed)
Patient's mother called and patient took call at the desk.

## 2015-07-05 NOTE — Progress Notes (Signed)
D   Pt has isolated to his room this evening   He had no request for anything and said he was ok but feeling very depressed  He does contract for safety and said he would let staff know if feeling overwhelmed or if he needs anything A   Verbal support given   Medications offered and educated on    Q 15 min checks R   Pt safe at present

## 2015-07-05 NOTE — ED Notes (Signed)
BH requested for RN not to call [employer] for transportation to [facility] until 1630.

## 2015-07-05 NOTE — ED Notes (Signed)
Patient ambulated to nurses desk and back to room and tolerated well.

## 2015-07-05 NOTE — Progress Notes (Signed)
Nursing admit note- Hence is a 29y/o male admitted voluntarily from [facility] following medical clearance.  He presented with his spouse who is reporting bizarre and unpredictable behaviors as noted in [facility] assessment.  He presents calm and cooperative stating "I'm depressed".  Denies SI/HI/AVH and does not acknowledge any of the reported behaviors from spouse.  He is vague with explanation of why he would be readmitted, and is not forthcoming with insight into current situation.  Denies physical problems.  Oriented to unit and belongings search completed.  15' checks initiated for safety.  R- Safety maintained.

## 2015-07-05 NOTE — Progress Notes (Signed)
Per Inetta Fermo, [facility], pt is accepted to [facility] bed 501-2 by Dr. Elna Breslow. Bed will be available at 4:30pm. RN report call to ex: [phone]. Admission is voluntary.  Spoke with [facility] re: pt's placement.  Ilean Skill, MSW, LCSW Clinical Social Work, Disposition  07/05/2015 [phone]

## 2015-07-05 NOTE — Progress Notes (Signed)
Followed up on inpatient placement efforts.  Referred to: [facility]- per Renea Ee [facility]- per Victorino Dike Both state referrals should be reviewed in the afternoon [facility]- refax per Cedric- referral not received 8/17 [facility]- will review per Dahlia Client  At capacity for appropriate units: [facility] (will only be able to take referrals for females on adult unit until further notice) [facility]  Left voicemail for [facility] and will refer as appropriate.  Ilean Skill, MSW, LCSW Clinical Social Work, Disposition  07/05/2015 [phone]

## 2015-07-06 ENCOUNTER — Encounter (HOSPITAL_COMMUNITY): Payer: Self-pay | Admitting: Psychiatry

## 2015-07-06 DIAGNOSIS — F411 Generalized anxiety disorder: Secondary | ICD-10-CM | POA: Clinically undetermined

## 2015-07-06 DIAGNOSIS — F332 Major depressive disorder, recurrent severe without psychotic features: Secondary | ICD-10-CM | POA: Diagnosis present

## 2015-07-06 DIAGNOSIS — F1221 Cannabis dependence, in remission: Secondary | ICD-10-CM | POA: Diagnosis present

## 2015-07-06 DIAGNOSIS — F17200 Nicotine dependence, unspecified, uncomplicated: Secondary | ICD-10-CM | POA: Diagnosis present

## 2015-07-06 DIAGNOSIS — F1290 Cannabis use, unspecified, uncomplicated: Secondary | ICD-10-CM

## 2015-07-06 MED ORDER — FLUOXETINE HCL 20 MG PO CAPS
20.0000 mg | ORAL_CAPSULE | Freq: Every day | ORAL | Status: DC
  Administered 2015-07-06 – 2015-07-08 (×3): 20 mg via ORAL
  Filled 2015-07-06 (×6): qty 1
  Filled 2015-07-06: qty 14
  Filled 2015-07-06: qty 1

## 2015-07-06 NOTE — H&P (Signed)
Psychiatric Admission Assessment Adult  Patient Identification: Gregory Graham MRN:  [id number] Date of Evaluation:  07/06/2015 Chief Complaint: Patient states " I have family stress.'      Principal Diagnosis: MDD (major depressive disorder), recurrent severe, without psychosis                                                             Diagnosis:   Patient Active Problem List   Diagnosis Date Noted  . MDD (major depressive disorder), recurrent severe, without psychosis [F33.2] 07/06/2015  . GAD (generalized anxiety disorder) [F41.1] 07/06/2015  . Cannabis use disorder, moderate, in sustained remission [F12.90] 07/06/2015  . Tobacco use disorder, moderate, dependence [F17.200] 07/06/2015      History of Present Illness:: Gregory Graham is a 29 y.o. AA male, with hx of depression , not on any medications ,  presented to [facility] voluntarily with c/o of worsening depression and anxiety.Pt per initial notes in ehr reported several stressors like " (1) unemployment; (2) financial; (3) being away from his wife and children. Pt also stated that his depression/anxiety has been getting worse for approx 54mo-56yr.  Pt stated his wife brought him to the hospital and she provided collateral info to the medical staff: This note is on what the pts wife has has been hearing and seeing from the pt:   " Per the pts wife on Tuesday the pt was outside yelling and the cops were called. When the cops arrived the pt was calm and cooperative. When the pt was on the phone with his wife the pt stated "when I get home I am going to kill them too" and "the evil things are going to take me over". The pts wife does not believe that the pt is remembering everything that he is doing and saying. When the pts wife asks the pt what he has said or who he is talking to and the pt has been saying, "I didn't say anything, you are hearing things". [facility]  [place] the [facility]. [facility] about "an audio" in the car outside, making him want to move the car further down the street so that he can not hear it. The pt has also been feeling like everyone around him is talking about him and his wife."    Patient seen this AM. Pt appeared to be withdrawn ,  slow , with depressed affect, poor eye contact. Pt also is a limited limited historian. Pt needs to be asked questions repeatedly , to get history . Pt reports feeling sad atleast 3/7 days a week , when he is withdrawn , stays in bed mostly and has sleep issues, appetite changes and has anxiety sx. Pt reports that he feels fidgety and restless and worry constantly about every thing. His main concern is his inability to provide for his family and he has been with out a job for a long time now. Pt used to work at the [employer] the past , but quit the job after DC from hospital last time. Pt denies suicidal ideation, denies any paranoia and denied AH to wProbation officereven though wife reported that pt  was having AH - see above. Pt also denies mood swings - but as documented earlier pt was having some mood issues prior to this admission.  Pt reports appetite changes and loss of weight of atleast 10 lbs in the past 1 month.  Pt endorsed cannabis use in the past , > 1 yr ago , as well as alcohol use occasional .  Pt reports previous hospitalization at [facility] - 05/17/15- Declined wanting to be started on any medications at that time. Pt denied suicide attempts.  Pt denied legal issues except when he was in jail for altercation with girlfriend in the past - no pending charges.    Elements:  Location:  Depression, anxiety, . Quality:  see above. Severity:  severe. Timing:  past few weeks. Duration:  months - worsening . Context:  no past hx of mental illness- hx of cannabis use disorder in remission. Associated  Signs/Symptoms: Depression Symptoms:  psychomotor retardation, fatigue, anxiety, (Hypo) Manic Symptoms:  denies Anxiety Symptoms:  denies Psychotic Symptoms: denies PTSD Symptoms: Negative Total Time spent with patient: 1 hour  Past Medical History:Irregular heart beat  Family History:  Family History  Problem Relation Age of Onset  . Mental illness Neg Hx    Social History:  History  Alcohol Use  . 1.2 oz/week  . 2 Cans of beer per week     History  Drug Use No    Social History   Social History  . Marital Status: Married    Spouse Name: N/A  . Number of Children: N/A  . Years of Education: N/A   Social History Main Topics  . Smoking status: Current Every Day Smoker -- 1.00 packs/day    Types: Cigarettes  . Smokeless tobacco: None  . Alcohol Use: 1.2 oz/week    2 Cans of beer per week  . Drug Use: No  . Sexual Activity: Yes     Comment: married   Other Topics Concern  . None   Social History Narrative   Additional Social History:    Pain Medications: See MAR  Prescriptions: See MAR  Over the Counter: See MAR  Longest period of sobriety (when/how long): 3-5 months for THC  Negative Consequences of Use: Work / Youth worker, Charity fundraiser relationships Withdrawal Symptoms: Other (Comment) Name of Substance 1: Alcohol  1 - Age of First Use: 26 YOM  1 - Amount (size/oz): 1 shot  1 - Frequency: Monthly  1 - Duration: 2 Yrs  1 - Last Use / Amount: Unk  Name of Substance 2: THC  2 - Age of First Use: Teens  2 - Amount (size/oz): Unk  2 - Frequency: Daily  2 - Duration: On-going   Patient was born in [place]. Pt went up to 12 th grade. Pt was raised by mom and dad. Pt had a good child hood. Pt believes in God. Pt has been married for 2 years - has 2 children - aged 39 and 56 .                Musculoskeletal: Strength & Muscle Tone: within normal limits Gait & Station: normal Patient leans: N/A  Psychiatric Specialty Exam: Physical Exam  Constitutional: He is  oriented to person, place, and time. He appears well-developed and well-nourished.  HENT:  Head: Normocephalic and atraumatic.  Eyes: Conjunctivae and EOM are normal.  Neck: Normal range of motion. Neck supple. No thyromegaly present.  Cardiovascular: Normal rate and regular rhythm.   Respiratory: Effort normal and breath  sounds normal.  GI: Soft. He exhibits no distension.  Musculoskeletal: Normal range of motion.  Neurological: He is alert and oriented to person, place, and time.  Skin: Skin is warm.  Psychiatric: His speech is normal. Thought content normal. His mood appears anxious. He is slowed and withdrawn. Cognition and memory are normal. He expresses impulsivity. He exhibits a depressed mood.    Review of Systems  Constitutional: Positive for weight loss (10 lbs in 1 month).  Neurological: Negative for headaches.  Psychiatric/Behavioral: Positive for depression. The patient is nervous/anxious and has insomnia.   All other systems reviewed and are negative.   Blood pressure 113/76, pulse 82, temperature 98.2 F (36.8 C), temperature source Oral, resp. rate 20, height '5\' 8"'  (1.727 m), weight 68.04 kg (150 lb), SpO2 100 %.Body mass index is 22.81 kg/(m^2).  General Appearance: Fairly Groomed  Engineer, water::  Poor  Speech:  Slow  Volume:  Decreased  Mood:  Anxious, Depressed and Hopeless  Affect:  Depressed  Thought Process:  Goal Directed  Orientation:  Full (Time, Place, and Person)  Thought Content:  Rumination wife reported that pt appeared paranoid as well as was having AH in the past  Suicidal Thoughts:  No  Homicidal Thoughts:  No  Memory:  Immediate;   Fair Recent;   Fair Remote;   Fair  Judgement:  Impaired  Insight:  Shallow  Psychomotor Activity:  Decreased  Concentration:  Fair  Recall:  [employer] of Knowledge:Fair  Language: Fair  Akathisia:  No  Handed:  Right  AIMS (if indicated):     Assets:  Physical Health Social Support Vocational/Educational   ADL's:  Intact  Cognition: WNL  Sleep:  Number of Hours: 6.75   Risk to Self: Is patient at risk for suicide?: No Risk to Others:  yes- pt endorsed HI towards wife in ED Prior Inpatient Therapy:  [facility] - x2  Prior Outpatient Therapy:   none Alcohol Screening: 1. How often do you have a drink containing alcohol?: Monthly or less 2. How many drinks containing alcohol do you have on a typical day when you are drinking?: 1 or 2 3. How often do you have six or more drinks on one occasion?: Never Preliminary Score: 0 9. Have you or someone else been injured as a result of your drinking?: No 10. Has a relative or friend or a doctor or another health worker been concerned about your drinking or suggested you cut down?: No Alcohol Use Disorder Identification Test Final Score (AUDIT): 1 Brief Intervention: AUDIT score less than 7 or less-screening does not suggest unhealthy drinking-brief intervention not indicated  Allergies:  No Known Allergies Lab Results:  No results found for this or any previous visit (from the past 65 hour(s)). Current Medications: Current Facility-Administered Medications  Medication Dose Route Frequency Provider Last Rate Last Dose  . acetaminophen (TYLENOL) tablet 650 mg  650 mg Oral Q6H PRN Kerrie Buffalo, NP      . alum & mag hydroxide-simeth (MAALOX/MYLANTA) 200-200-20 MG/5ML suspension 30 mL  30 mL Oral Q4H PRN Kerrie Buffalo, NP      . FLUoxetine (PROZAC) capsule 20 mg  20 mg Oral Daily Ceclia Koker, MD      . magnesium hydroxide (MILK OF MAGNESIA) suspension 30 mL  30 mL Oral Daily PRN Kerrie Buffalo, NP      . traZODone (DESYREL) tablet 50 mg  50 mg Oral QHS PRN Kerrie Buffalo, NP       PTA Medications:  No prescriptions prior to admission    Previous Psychotropic Medications: No   Substance Abuse History in the last 12 months:  Yes.  cannabis - 1 year ago , alcohol occasional, tobacco - 1 pk a day     Consequences of Substance Abuse: NA  Results  for orders placed or performed during the hospital encounter of 07/04/15 (from the past 72 hour(s))  Urine rapid drug screen (hosp performed)     Status: None   Collection Time: 07/04/15  2:48 AM  Result Value Ref Range   Opiates NONE DETECTED NONE DETECTED   Cocaine NONE DETECTED NONE DETECTED   Benzodiazepines NONE DETECTED NONE DETECTED   Amphetamines NONE DETECTED NONE DETECTED   Tetrahydrocannabinol NONE DETECTED NONE DETECTED   Barbiturates NONE DETECTED NONE DETECTED    Comment:        DRUG SCREEN FOR MEDICAL PURPOSES ONLY.  IF CONFIRMATION IS NEEDED FOR ANY PURPOSE, NOTIFY LAB WITHIN 5 DAYS.        LOWEST DETECTABLE LIMITS FOR URINE DRUG SCREEN Drug Class       Cutoff (ng/mL) Amphetamine      1000 Barbiturate      200 Benzodiazepine   482 Tricyclics       500 Opiates          300 Cocaine          300 THC              50   Ethanol     Status: None   Collection Time: 07/04/15  2:48 AM  Result Value Ref Range   Alcohol, Ethyl (B) <5 <5 mg/dL    Comment:        LOWEST DETECTABLE LIMIT FOR SERUM ALCOHOL IS 5 mg/dL FOR MEDICAL PURPOSES ONLY   CBC with Differential     Status: Abnormal   Collection Time: 07/04/15  2:48 AM  Result Value Ref Range   WBC 6.8 4.0 - 10.5 K/uL   RBC 4.66 4.22 - 5.81 MIL/uL   Hemoglobin 12.6 (L) 13.0 - 17.0 g/dL   HCT 38.3 (L) 39.0 - 52.0 %   MCV 82.2 78.0 - 100.0 fL   MCH 27.0 26.0 - 34.0 pg   MCHC 32.9 30.0 - 36.0 g/dL   RDW 14.2 11.5 - 15.5 %   Platelets 253 150 - 400 K/uL   Neutrophils Relative % 50 43 - 77 %   Neutro Abs 3.4 1.7 - 7.7 K/uL   Lymphocytes Relative 40 12 - 46 %   Lymphs Abs 2.7 0.7 - 4.0 K/uL   Monocytes Relative 6 3 - 12 %   Monocytes Absolute 0.4 0.1 - 1.0 K/uL   Eosinophils Relative 3 0 - 5 %   Eosinophils Absolute 0.2 0.0 - 0.7 K/uL   Basophils Relative 1 0 - 1 %   Basophils Absolute 0.0 0.0 - 0.1 K/uL  Comprehensive metabolic panel     Status: Abnormal   Collection Time: 07/04/15  2:48 AM  Result Value Ref  Range   Sodium 140 135 - 145 mmol/L   Potassium 3.1 (L) 3.5 - 5.1 mmol/L   Chloride 103 101 - 111 mmol/L   CO2 30 22 - 32 mmol/L   Glucose, Bld 105 (H) 65 - 99 mg/dL   BUN 12 6 - 20 mg/dL   Creatinine, Ser 1.09 0.61 - 1.24 mg/dL   Calcium 8.7 (L) 8.9 - 10.3 mg/dL   Total Protein 6.5 6.5 - 8.1 g/dL   Albumin  3.6 3.5 - 5.0 g/dL   AST 34 15 - 41 U/L   ALT 17 17 - 63 U/L   Alkaline Phosphatase 49 38 - 126 U/L   Total Bilirubin 0.3 0.3 - 1.2 mg/dL   GFR calc non Af Amer >60 >60 mL/min   GFR calc Af Amer >60 >60 mL/min    Comment: (NOTE) The eGFR has been calculated using the CKD EPI equation. This calculation has not been validated in all clinical situations. eGFR's persistently <60 mL/min signify possible Chronic Kidney Disease.    Anion gap 7 5 - 15    Observation Level/Precautions:  15 minute checks  Laboratory:  tsh - if not already done  Psychotherapy:  Individual and group therapy   Medications:  As needed  Consultations: Social worker  Discharge Concerns:  Stability and safety  Psychological Evaluations: No   Treatment Plan Summary: Daily contact with patient to assess and evaluate symptoms and progress in treatment and Medication management  Patient will benefit from inpatient treatment and stabilization.  Estimated length of stay is 5-7 days.  Reviewed past medical records,treatment plan.  Will start Prozac 20 mg po daily for affective sx. Pt provided with medication education. Continue Trazodone 50 mg po qhs prn for sleep. Provided smoking cessation counseling . Will continue to monitor vitals ,medication compliance and treatment side effects while patient is here.  Will monitor for medical issues as well as call consult as needed.  Reviewed labs CBC, CMP,UDS,BAL- will get TSH. CSW will start working on disposition.  Patient to participate in therapeutic milieu .       Medical Decision Making:  Review of Psycho-Social Stressors (1), Review or order clinical  lab tests (1), Established Problem, Worsening (2), Review of Medication Regimen & Side Effects (2) and Review of New Medication or Change in Dosage (2)  I certify that inpatient services furnished can reasonably be expected to improve the patient's condition.   Philena Obey MD 8/19/201610:54 AM

## 2015-07-06 NOTE — Progress Notes (Signed)
D   Pt has isolated to his room this evening  He is guarded and suspicious   He had no request for anything and said he was ok but feeling very depressed  He does contract for safety and said he would let staff know if feeling overwhelmed or if he needs anything A   Verbal support given   Medications offered and educated on    Q 15 min checks R   Pt safe at present

## 2015-07-06 NOTE — Progress Notes (Signed)
Patient ID: Gregory Graham, male   DOB: 12-24-1985, 29 y.o.   MRN: [id number]   Pt currently presents with a flat affect and depressed behavior. Per self inventory, pt rates depression at a 1, hopelessness 1 and anxiety 0. Pt's daily goal is to "trusting other people" and they intend to do so by "speak up." Pt reports fair sleep, a good appetite, normal energy and good concentration.   Pt provided with medications per providers orders. Pt's labs and vitals were monitored throughout the day. Pt supported emotionally and encouraged to express concerns and questions. Pt educated on medications, given handout on antidepressant.    Pt's safety ensured with 15 minute and environmental checks. Pt currently denies SI/HI and A/V hallucinations. Pt verbally agrees to seek staff if SI/HI or A/VH occurs and to consult with staff before acting on these thoughts. Pt remains in bed for most of the day. Interaction with staff is minimal, pt eyes dart frequently around when speaking with writer, pt watchful of environment. Will continue POC.

## 2015-07-06 NOTE — [facility] Group Notes (Signed)
[facility] LCSW Group Therapy  07/06/2015  1:05 PM  Type of Therapy:  Group therapy  Participation Level:  Active  Participation Quality:  Attentive  Affect:  Flat  Cognitive:  Oriented  Insight:  Limited  Engagement in Therapy:  Limited  Modes of Intervention:  Discussion, Socialization  Summary of Progress/Problems:  Chaplain was here to lead a group on themes of hope and courage. "My faith is the source of my hope.  And my family.  My wife, my kids, my mother.  They support me no matter what, and that is what is getting me through this."  Stayed the entire time.  Sat quietly.  Did not volunteer anything else. Daryel Gerald B 07/06/2015 1:46 PM

## 2015-07-06 NOTE — Progress Notes (Signed)
[facility] Group Notes:  (Nursing/MHT/Case Management/Adjunct)  Date:  07/06/2015  Time:  9:16 PM  Type of Therapy:  Psychoeducational Skills  Participation Level:  Active  Participation Quality:  Appropriate  Affect:  Appropriate  Cognitive:  Appropriate  Insight:  Good  Engagement in Group:  Developing/Improving  Modes of Intervention:  Education  Summary of Progress/Problems: The patient stated that he had a good day overall and this was attributed to having a good talk with his peers. As for the theme of the day, his relapse prevention will be to cut back on his smoking.   Gregory Graham 07/06/2015, 9:16 PM

## 2015-07-06 NOTE — [facility] Counselor (Signed)
Adult Psychosocial Assessment Update Interdisciplinary Team  Previous [facility] admissions/discharges:  Admissions Discharges  Date: 05/18/15 Date:  Date: Date:  Date: Date:  Date: Date:  Date: Date:   Changes since the last Psychosocial Assessment (including adherence to outpatient mental health and/or substance abuse treatment, situational issues contributing to decompensation and/or relapse). Per the pts wife on Tuesday the pt was outside yelling and the cops were called. When the cops arrived the pt was calm and cooperative. When the pt was on the phone with his wife the pt stated "when I get home I am going to kill them too" and "the evil things are going to take me over". The pts wife does not believe that the pt is remembering everything that he is going and saying. When the pts wife asks the pt what he has said or who he is talking to and the pt has been saying, "I didn't say anything, you are hearing things". On Monday around 0300 the pt walked from [facility] to [place], this is unusual for the pt. Since about 1 week after the pt was discharged from [facility] the pt has been talking about "an audio" in the car outside, making him want to move the car further down the street so that he can not hear it. The pt has also been feeling like everyone around him is talking about him and his wife.             Discharge Plan 1. Will you be returning to the same living situation after discharge?   Yes:x home No:      If no, what is your plan?           2. Would you like a referral for services when you are discharged? Yes:  x   If yes, for what services?  No:       Pt is not asking for services, but was willing to sign a release for [employer] and Recommendations (to be completed by the evaluator) Gregory Graham is a 29 YO AA male who was with [place] 6 weeks ago, and is readmitted for depression with psychosis [paranoia]  He denies the paranoia, and is  refusing medication to address that issue.  He is willing to take Prozac, and was unwilling to take any medication last time.  Nor did he follow up with outpt appointments.  He has not been working because he felt like people at work were talking about him, and has been feeling a lot of financial pressure.  He can benefit from crises stabilization, medication management, therapeutic milieu and referral for services.                       Signature:  Ida Rogue, 07/06/2015 4:10 PM

## 2015-07-06 NOTE — [facility] Suicide Risk Assessment (Signed)
[facility] INPATIENT:  Family/Significant Other Suicide Prevention Education  Suicide Prevention Education:  Education Completed; No one has been identified by the patient as the family member/significant other with whom the patient will be residing, and identified as the person(s) who will aid the patient in the event of a mental health crisis (suicidal ideations/suicide attempt).  With written consent from the patient, the family member/significant other has been provided the following suicide prevention education, prior to the and/or following the discharge of the patient.  The suicide prevention education provided includes the following:  Suicide risk factors  Suicide prevention and interventions  National Suicide Hotline telephone number  [facility] assessment telephone number  [facility] Emergency Assistance 911  [facility] and/or Residential Mobile Crisis Unit telephone number  Request made of family/significant other to:  Remove weapons (e.g., guns, rifles, knives), all items previously/currently identified as safety concern.    Remove drugs/medications (over-the-counter, prescriptions, illicit drugs), all items previously/currently identified as a safety concern.  The family member/significant other verbalizes understanding of the suicide prevention education information provided.  The family member/significant other agrees to remove the items of safety concern listed above. The patient did not endorse SI at the time of admission, nor did the patient c/o SI during the stay here.  SPE not required. However, I did talk with wife Delrae Rend, [phone], and encouraged her to take out IVC papers next time he gets this paranoid so we have more leverage with meds.  She agreed as she is very concerned about his level of paranoia.  Gregory Graham 07/06/2015, 4:15 PM

## 2015-07-06 NOTE — [facility] Suicide Risk Assessment (Signed)
[facility] Admission Suicide Risk Assessment   Nursing information obtained from:  Patient Demographic factors:  Male Current Mental Status:  NA Loss Factors:  NA Historical Factors:  NA Risk Reduction Factors:  Responsible for children under 28 years of age Total Time spent with patient: 30 minutes Principal Problem: MDD (major depressive disorder), recurrent severe, without psychosis Diagnosis:   Patient Active Problem List   Diagnosis Date Noted  . MDD (major depressive disorder), recurrent severe, without psychosis [F33.2] 07/06/2015  . GAD (generalized anxiety disorder) [F41.1] 07/06/2015  . Cannabis use disorder, moderate, in sustained remission [F12.90] 07/06/2015  . Tobacco use disorder, moderate, dependence [F17.200] 07/06/2015     Continued Clinical Symptoms:  Alcohol Use Disorder Identification Test Final Score (AUDIT): 1 The "Alcohol Use Disorders Identification Test", Guidelines for Use in Primary Care, Second Edition.  World Science writer [facility]). Score between 0-7:  no or low risk or alcohol related problems. Score between 8-15:  moderate risk of alcohol related problems. Score between 16-19:  high risk of alcohol related problems. Score 20 or above:  warrants further diagnostic evaluation for alcohol dependence and treatment.   CLINICAL FACTORS:   Depression:   Hopelessness   Musculoskeletal: Strength & Muscle Tone: within normal limits Gait & Station: normal Patient leans: N/A  Psychiatric Specialty Exam: Physical Exam  ROS  Blood pressure 113/76, pulse 82, temperature 98.2 F (36.8 C), temperature source Oral, resp. rate 20, height  (1.727 m), weight 68.04 kg (150 lb), SpO2 100 %.Body mass index is 22.81 kg/(m^2).                    Please see H&P.                                      COGNITIVE FEATURES THAT CONTRIBUTE TO RISK:  Closed-mindedness, Polarized thinking and Thought constriction (tunnel vision)    SUICIDE  RISK:   Mild:  Suicidal ideation of limited frequency, intensity, duration, and specificity.  There are no identifiable plans, no associated intent, mild dysphoria and related symptoms, good self-control (both objective and subjective assessment), few other risk factors, and identifiable protective factors, including available and accessible social support.  PLAN OF CARE: Please see H&P.   Medical Decision Making:  Review of Psycho-Social Stressors (1), Review or order clinical lab tests (1), Review and summation of old records (2), Established Problem, Worsening (2), Review of Medication Regimen & Side Effects (2) and Review of New Medication or Change in Dosage (2)  I certify that inpatient services furnished can reasonably be expected to improve the patient's condition.   Swayze Kozuch MD 07/06/2015, 10:52 AM

## 2015-07-06 NOTE — Tx Team (Signed)
Interdisciplinary Treatment Plan Update (Adult)  Date:  07/06/2015   Time Reviewed:  8:21 AM   Progress in Treatment: Attending groups: Yes. Participating in groups:  Minimally Taking medication as prescribed:  Yes. Tolerating medication:  Yes. Family/Significant othe contact made:  Yes  Gregory Graham Patient understands diagnosis:  No  Limited insight Discussing patient identified problems/goals with staff:  Yes, see initial care plan. Medical problems stabilized or resolved:  Yes. Denies suicidal/homicidal ideation: Yes. Issues/concerns per patient self-inventory:  No. Other:  New problem(s) identified:  Discharge Plan or Barriers: return home, follow up outpt  Reason for Continuation of Hospitalization: Delusions  Depression Medication stabilization Other; describe Paranoia  Comments:  CSW spoke with pt's Gregory Graham, Tyheem Boughner [phone]. Gregory Graham states pt first began exhibiting seemingly paranoid behavior following his d/c from [facility] in 05/17/2015. She states that about 2 weeks after d/c he left his home, where she and his children live, "and I did not hear from him for so long that I filled out a missing persons report. I found out he had gone to stay with his aunt and cousins, and he told me he just needed some time away from home to get himself together. However, his family told me he had been telling them he had to leave home because the kids and I were trying to kill him."   Prozac trial  Estimated length of stay: 4-5 days  New goal(s):  Review of initial/current patient goals per problem list:   Review of initial/current patient goals per problem list:  1. Goal(s): Patient will participate in aftercare plan   Met: Yes   Target date: 3-5 days post admission date   As evidenced by: Patient will participate within aftercare plan AEB aftercare provider and housing plan at discharge being identified.  07/06/2015:Pt plans to return home, follow up outpt.      2. Goal (s): Patient  will exhibit decreased depressive symptoms and suicidal ideations.   Met:    Target date: 3-5 days post admission date   As evidenced by: Patient will utilize self rating of depression at 3 or below and demonstrate decreased signs of depression or be deemed stable for discharge by MD.     3. Goal(s): Patient will demonstrate decreased signs and symptoms of anxiety.   Met: No   Target date: 3-5 days post admission date   As evidenced by: Patient will utilize self rating of anxiety at 3 or below and demonstrated decreased signs of anxiety, or be deemed stable for discharge by MD 07/06/2015  Rates his depression a 5 today         5. Goal(s): Patient will demonstrate decreased signs of psychosis  * Met: No  * Target date: 3-5 days post admission date  * As evidenced by: Patient will demonstrate decreased frequency of AVH or return to baseline function 07/06/2015 Pt presents with paranoia.  Believes there is nothing wrong with him.  Unable to work due to paranoia. Refusing meds to address this symptom           Attendees: Patient:  07/06/2015 8:21 AM   Family:   07/06/2015 8:21 AM   Physician:  Ursula Alert, MD 07/06/2015 8:21 AM   Nursing:   Marcella Dubs, RN 07/06/2015 8:21 AM   CSW:    Roque Lias, [place]   07/06/2015 8:21 AM   Other:  07/06/2015 8:21 AM   Other:   07/06/2015 8:21 AM   Other:  Lars Pinks, Nurse CM 07/06/2015 8:21 AM  Other:  Lucinda Dell, Beverly Sessions TCT 07/06/2015 8:21 AM   Other:  Norberto Sorenson, [place]  07/06/2015 8:21 AM   Other:  07/06/2015 8:21 AM   Other:  07/06/2015 8:21 AM   Other:  07/06/2015 8:21 AM   Other:  07/06/2015 8:21 AM   Other:  07/06/2015 8:21 AM   Other:   07/06/2015 8:21 AM    Scribe for Treatment Team:   Trish Mage, 07/06/2015 8:21 AM

## 2015-07-07 MED ORDER — POTASSIUM CHLORIDE CRYS ER 20 MEQ PO TBCR
20.0000 meq | EXTENDED_RELEASE_TABLET | Freq: Two times a day (BID) | ORAL | Status: DC
  Administered 2015-07-08: 20 meq via ORAL
  Filled 2015-07-07 (×10): qty 1

## 2015-07-07 NOTE — Progress Notes (Signed)
Patient ID: Gregory Graham, male   DOB: [birth year], 29 y.o.   MRN: [id number]   Pt currently presents with a flat affect and guarded, depressed behavior. Pt forwards little to writer today and has elective mutism. Pt refuses potassium supplement today and states to writer "I'm not taking that, don't ask me again" in an aggressive manner.    Pt provided with medications per providers orders. Pt's labs and vitals were monitored throughout the day. Pt supported emotionally and encouraged to express concerns and questions. Pt educated on medications.  Pt's safety ensured with 15 minute and environmental checks. Pt denies SI/HI and A/VH by shaking his head no when asked.  Will continue POC.

## 2015-07-07 NOTE — [facility] Group Notes (Signed)
[facility] LCSW Group Therapy  07/07/2015   11:15 AM  Type of Therapy:  Group Therapy  Participation Level:  Actively attentive  Participation Quality:  Attentive  Affect:  Flat  Cognitive:  Alert and Oriented  Insight:  None  Engagement in Therapy:  Developing  Modes of Intervention:  Discussion, Exploration, Orientation, Rapport Building, Socialization and Support  Summary of Progress/Problems: The main focus of today's process group was for the patient to identify ways in which they have in the past sabotaged their own recovery. The stages of change model was used to help patients determine where they are in their process today. [name] shared that his lack of employment is major stressor as it affects his self esteem and self worth and financial status. He is in contemplation stage of asking for support and deciding next actions.    Clide Dales 07/07/2015

## 2015-07-07 NOTE — Progress Notes (Signed)
[facility] Group Notes:  (Nursing/MHT/Case Management/Adjunct)  Date:  07/07/2015  Time:  8:47 PM  Type of Therapy:  Psychoeducational Skills  Participation Level:  Minimal  Participation Quality:  Resistant  Affect:  Flat and Irritable  Cognitive:  Lacking  Insight:  None  Engagement in Group:  Resistant  Modes of Intervention:  Education  Summary of Progress/Problems: The patient was abrupt and little to share except to say that he had a "great" day. As a theme for the day, his support system will consist of "me". The patient would not share with the group despite being encouraged to do so by this author.   Hazle Coca S 07/07/2015, 8:47 PM

## 2015-07-07 NOTE — Progress Notes (Signed)
Adult Psychoeducational Group Note  Date:  07/07/2015 Time:  09:15am  Group Topic/Focus:  Healthy Communication:   The focus of this group is to discuss communication, barriers to communication, as well as healthy ways to communicate with others.  Participation Level:  None  Participation Quality:  Drowsy and Inattentive  Affect:  Flat  Cognitive:  UTA  Insight: None  Engagement in Group:  None  Modes of Intervention:  Activity, Discussion, Education, Role-play and Support  Additional Comments:  Pt attended group but declined to share or participate in the activities.  Aurora Mask 07/07/2015, 11:11 AM

## 2015-07-07 NOTE — Progress Notes (Signed)
Patient ID: Gregory Graham, male   DOB: 1986/01/21, 29 y.o.   MRN: [id number] [facility] MD Progress Note  07/07/2015 3:47 PM Taelyn Nemes  MRN:  [id number] Subjective:   Patient states "When can I be released."      Objective:  Patient seen and chart is reviewed.Gregory Graham is a 29 y.o. AA male, with hx of depression , not on any medications , presented to [facility] voluntarily with c/o of worsening depression and anxiety.Pt per initial notes in ehr reported several stressors like " (1) unemployment; (2) financial; (3) being away from his wife and children.  Pt today continues to be guarded , suspicious , wants to be discharged . Pt currently on Prozac - states that he is tolerating it well. Pt denies any AH/VH and declines the need for an antipsychotic at this time. Pt denies sleep issues and reports no concerns.      Principal Problem: MDD (major depressive disorder), recurrent severe, without psychosis Diagnosis:   Patient Active Problem List   Diagnosis Date Noted  . MDD (major depressive disorder), recurrent severe, without psychosis [F33.2] 07/06/2015  . GAD (generalized anxiety disorder) [F41.1] 07/06/2015  . Cannabis use disorder, moderate, in sustained remission [F12.90] 07/06/2015  . Tobacco use disorder, moderate, dependence [F17.200] 07/06/2015   Total Time spent with patient: 25 minutes  Past Medical History:  Past Medical History  Diagnosis Date  . MDD (major depressive disorder), single episode, severe with psychotic features 05/19/2015   History reviewed. No pertinent past surgical history. Family History:  Family History  Problem Relation Age of Onset  . Mental illness Neg Hx    Social History:  History  Alcohol Use  . 1.2 oz/week  . 2 Cans of beer per week     History  Drug Use No    Social History   Social History  . Marital Status: Married    Spouse Name: N/A  . Number of Children: N/A  . Years of Education: N/A   Social History Main  Topics  . Smoking status: Current Every Day Smoker -- 1.00 packs/day    Types: Cigarettes  . Smokeless tobacco: None  . Alcohol Use: 1.2 oz/week    2 Cans of beer per week  . Drug Use: No  . Sexual Activity: Yes     Comment: married   Other Topics Concern  . None   Social History Narrative   Additional History:    Sleep: Fair  Appetite:  Fair     Musculoskeletal: Strength & Muscle Tone: within normal limits Gait & Station: normal Patient leans: N/A  Psychiatric Specialty Exam: Physical Exam  Psychiatric: His speech is delayed. He is withdrawn. Thought content is paranoid. Cognition and memory are normal. He exhibits a depressed mood.    Review of Systems  Constitutional: Negative for fever, chills, weight loss and malaise/fatigue.  HENT: Negative for ear pain, hearing loss and tinnitus.   Eyes: Negative for blurred vision, double vision, photophobia, pain and discharge.  Respiratory: Negative for cough, hemoptysis, sputum production and shortness of breath.   Cardiovascular: Negative for chest pain, palpitations, orthopnea, claudication and leg swelling.  Gastrointestinal: Negative for heartburn, nausea, vomiting, abdominal pain, diarrhea and constipation.  Genitourinary: Negative for dysuria, urgency, frequency and hematuria.  Musculoskeletal: Negative for myalgias, back pain, joint pain and neck pain.  Skin: Negative for itching and rash.  Neurological: Negative for dizziness, tingling, tremors, sensory change, speech change and headaches.  Endo/Heme/Allergies: Negative for environmental allergies and polydipsia. Does not  bruise/bleed easily.  Psychiatric/Behavioral: Positive for depression. Negative for suicidal ideas and hallucinations. The patient is nervous/anxious. The patient does not have insomnia.   All other systems reviewed and are negative.   Blood pressure 115/78, pulse 111, temperature 98.1 F (36.7 C), temperature source Oral, resp. rate 18, height 5'  8" (1.727 m), weight 68.04 kg (150 lb), SpO2 100 %.Body mass index is 22.81 kg/(m^2).  General Appearance: Casual  Eye Contact::  Poor  Speech:  Slow  Volume:  Decreased  Mood:  Depressed  Affect:  Constricted  Thought Process:  Coherent  Orientation:  Full (Time, Place, and Person)  Thought Content:  Paranoid Ideation and Rumination  Suicidal Thoughts:  No  Homicidal Thoughts:  No  Memory:  Immediate;   Fair Recent;   Fair Remote;   Fair  Judgement:  Impaired  Insight:  Lacking  Psychomotor Activity:  Decreased  Concentration:  Fair  Recall:  [employer] of Knowledge:Fair  Language: Fair  Akathisia:  No  Handed:  Right  AIMS (if indicated):     Assets:  Physical Health Social Support Vocational/Educational  ADL's:  Intact  Cognition: WNL  Sleep:  Number of Hours: 6.75   Current Medications: Current Facility-Administered Medications  Medication Dose Route Frequency Provider Last Rate Last Dose  . acetaminophen (TYLENOL) tablet 650 mg  650 mg Oral Q6H PRN Adonis Brook, NP      . alum & mag hydroxide-simeth (MAALOX/MYLANTA) 200-200-20 MG/5ML suspension 30 mL  30 mL Oral Q4H PRN Adonis Brook, NP      . FLUoxetine (PROZAC) capsule 20 mg  20 mg Oral Daily Jomarie Longs, MD   20 mg at 07/07/15 0842  . magnesium hydroxide (MILK OF MAGNESIA) suspension 30 mL  30 mL Oral Daily PRN Adonis Brook, NP      . potassium chloride SA (K-DUR,KLOR-CON) CR tablet 20 mEq  20 mEq Oral BID Jomarie Longs, MD   20 mEq at 07/07/15 1052  . traZODone (DESYREL) tablet 50 mg  50 mg Oral QHS PRN Adonis Brook, NP        Lab Results: No results found for this or any previous visit (from the past 48 hour(s)).  Physical Findings: AIMS: Facial and Oral Movements Muscles of Facial Expression: None, normal Lips and Perioral Area: None, normal Jaw: None, normal Tongue: None, normal,Extremity Movements Upper (arms, wrists, hands, fingers): None, normal Lower (legs, knees, ankles, toes): None,  normal, Trunk Movements Neck, shoulders, hips: None, normal, Overall Severity Severity of abnormal movements (highest score from questions above): None, normal Incapacitation due to abnormal movements: None, normal Patient's awareness of abnormal movements (rate only patient's report): No Awareness, Dental Status Current problems with teeth and/or dentures?: No Does patient usually wear dentures?: No  CIWA:    COWS:     Assessment: Pt continues to take Prozac, but continues to decline any other treatment options. He is focussed on DC.   Treatment Plan Summary: Daily contact with patient to assess and evaluate symptoms and progress in treatment and Medication management  Will contiue Prozac 20 mg po daily for affective sx. Pt provided with medication education. Continue Trazodone 50 mg po qhs prn for sleep. Provided smoking cessation counseling . Will continue to monitor vitals ,medication compliance and treatment side effects while patient is here.  Will monitor for medical issues as well as call consult as needed.  Reviewed labs  TSH ordered was not done - pt refused. CSW will start working on disposition.  Patient to  participate in therapeutic milieu .    Medical Decision Making:  Review of Psycho-Social Stressors (1), Review or order clinical lab tests (1) and Review of Medication Regimen & Side Effects (2)  Gohan Collister,Sarammamd 07/07/2015, 3:47 PM

## 2015-07-07 NOTE — Progress Notes (Signed)
D: Patient was seen on day room playing card with peers. Patient presents with blunt affect and elective mutism towards staff members but tend to respond well with peers. No respond to any questions asked. Patient refused lab work this evening. Patient bluntly stated "No".  A: Patient encouraged to be compliant with treatment plan and verbalize any concerns to staff. Every 15 minutes check for safety maintained. Will continue to monitor patient. R: Patient not compliant with treatment plan.

## 2015-07-08 NOTE — [facility] Group Notes (Signed)
[facility] Group Notes:  (Clinical Social Work)  07/08/2015  [facility] Group Notes:  (Clinical Social Work)  07/08/2015  11:00AM-12:00PM  Summary of Progress/Problems:  The main focus of today's process group was to listen to a variety of genres of music and to identify that different types of music provoke different responses.  The patient then was able to identify personally what was soothing for them, as well as energizing.  Handouts were used to record feelings evoked, as well as how patient can personally use this knowledge in sleep habits, with depression, and with other symptoms.  The patient expressed understanding of concepts, as well as knowledge of how each type of music affected him/her and how this can be used at home as a wellness/recovery tool.  Type of Therapy:  Music Therapy   Participation Level:  Active  Participation Quality:  Attentive and Sharing  Affect:  Blunted  Cognitive:  Oriented  Insight:  Engaged  Engagement in Therapy:  Engaged  Modes of Intervention:   Activity, Exploration  Decklan Mau Grossman-Orr, LCSW 07/08/2015    

## 2015-07-08 NOTE — Plan of Care (Signed)
Problem: Ineffective individual coping Goal: STG: Patient will remain free from self harm Outcome: Progressing Patient remains free from self harm.      

## 2015-07-08 NOTE — Progress Notes (Signed)
Patient ID: Gregory Graham, male   DOB: December 17, 1985, 29 y.o.   MRN: [id number] [facility] MD Progress Note  07/08/2015 2:35 PM Gregory Graham  MRN:  [id number] Subjective:   Patient states "I am ok."      Objective:  Patient seen and chart is reviewed.Gregory Graham is a 29 y.o. AA male, with hx of depression , not on any medications , presented to [facility] voluntarily with c/o of worsening depression and anxiety.Pt per initial notes in ehr reported several stressors like " (1) unemployment; (2) financial; (3) being away from his wife and children.  Pt today continues to be guarded , suspicious . Pt is a very limited historian . Per staff - some concerns whether pt has been compliant on his medications. Pt stays withdrawn ,paranoid and suspicious - but has been denying any AH/VH or paranoia . Pt continues to decline the need for an antipsychotic . He is focussed on DC. Pt currently on Prozac - states that he is tolerating it well.     Principal Problem: MDD (major depressive disorder), recurrent severe, without psychosis Diagnosis:   Patient Active Problem List   Diagnosis Date Noted  . MDD (major depressive disorder), recurrent severe, without psychosis [F33.2] 07/06/2015  . GAD (generalized anxiety disorder) [F41.1] 07/06/2015  . Cannabis use disorder, moderate, in sustained remission [F12.90] 07/06/2015  . Tobacco use disorder, moderate, dependence [F17.200] 07/06/2015   Total Time spent with patient: 25 minutes  Past Medical History:  Past Medical History  Diagnosis Date  . MDD (major depressive disorder), single episode, severe with psychotic features 05/19/2015   History reviewed. No pertinent past surgical history. Family History:  Family History  Problem Relation Age of Onset  . Mental illness Neg Hx    Social History:  History  Alcohol Use  . 1.2 oz/week  . 2 Cans of beer per week     History  Drug Use No    Social History   Social History  . Marital Status:  Married    Spouse Name: N/A  . Number of Children: N/A  . Years of Education: N/A   Social History Main Topics  . Smoking status: Current Every Day Smoker -- 1.00 packs/day    Types: Cigarettes  . Smokeless tobacco: None  . Alcohol Use: 1.2 oz/week    2 Cans of beer per week  . Drug Use: No  . Sexual Activity: Yes     Comment: married   Other Topics Concern  . None   Social History Narrative   Additional History:    Sleep: Fair  Appetite:  Fair     Musculoskeletal: Strength & Muscle Tone: within normal limits Gait & Station: normal Patient leans: N/A  Psychiatric Specialty Exam: Physical Exam  Psychiatric: His speech is delayed. He is withdrawn. Thought content is paranoid. Cognition and memory are normal. He exhibits a depressed mood.    Review of Systems  Constitutional: Negative for fever, chills, weight loss and malaise/fatigue.  HENT: Negative for ear pain, hearing loss and tinnitus.   Eyes: Negative for blurred vision, double vision, photophobia, pain and discharge.  Respiratory: Negative for cough, hemoptysis, sputum production and shortness of breath.   Cardiovascular: Negative for chest pain, palpitations, orthopnea, claudication and leg swelling.  Gastrointestinal: Negative for heartburn, nausea, vomiting, abdominal pain, diarrhea and constipation.  Genitourinary: Negative for dysuria, urgency, frequency and hematuria.  Musculoskeletal: Negative for myalgias, back pain, joint pain and neck pain.  Skin: Negative for itching and rash.  Neurological:  Negative for dizziness, tingling, tremors, sensory change, speech change and headaches.  Endo/Heme/Allergies: Negative for environmental allergies and polydipsia. Does not bruise/bleed easily.  Psychiatric/Behavioral: Positive for depression. Negative for suicidal ideas and hallucinations. The patient is nervous/anxious. The patient does not have insomnia.   All other systems reviewed and are negative.   Blood  pressure 121/78, pulse 94, temperature 98.3 F (36.8 C), temperature source Oral, resp. rate 18, height  (1.727 m), weight 68.04 kg (150 lb), SpO2 100 %.Body mass index is 22.81 kg/(m^2).  General Appearance: Casual  Eye Contact::  Poor  Speech:  Slow  Volume:  Decreased  Mood:  Depressed  Affect:  Constricted  Thought Process:  Coherent  Orientation:  Full (Time, Place, and Person)  Thought Content:  Paranoid Ideation and Rumination  Suicidal Thoughts:  No  Homicidal Thoughts:  No  Memory:  Immediate;   Fair Recent;   Fair Remote;   Fair  Judgement:  Impaired  Insight:  Lacking  Psychomotor Activity:  Decreased  Concentration:  Fair  Recall:  [employer] of Knowledge:Fair  Language: Fair  Akathisia:  No  Handed:  Right  AIMS (if indicated):     Assets:  Physical Health Social Support Vocational/Educational  ADL's:  Intact  Cognition: WNL  Sleep:  Number of Hours: 6.75   Current Medications: Current Facility-Administered Medications  Medication Dose Route Frequency Provider Last Rate Last Dose  . acetaminophen (TYLENOL) tablet 650 mg  650 mg Oral Q6H PRN Adonis Brook, NP      . alum & mag hydroxide-simeth (MAALOX/MYLANTA) 200-200-20 MG/5ML suspension 30 mL  30 mL Oral Q4H PRN Adonis Brook, NP      . FLUoxetine (PROZAC) capsule 20 mg  20 mg Oral Daily Jomarie Longs, MD   20 mg at 07/08/15 4098  . magnesium hydroxide (MILK OF MAGNESIA) suspension 30 mL  30 mL Oral Daily PRN Adonis Brook, NP      . potassium chloride SA (K-DUR,KLOR-CON) CR tablet 20 mEq  20 mEq Oral BID Jomarie Longs, MD   20 mEq at 07/08/15 0821  . traZODone (DESYREL) tablet 50 mg  50 mg Oral QHS PRN Adonis Brook, NP        Lab Results: No results found for this or any previous visit (from the past 48 hour(s)).  Physical Findings: AIMS: Facial and Oral Movements Muscles of Facial Expression: None, normal Lips and Perioral Area: None, normal Jaw: None, normal Tongue: None,  normal,Extremity Movements Upper (arms, wrists, hands, fingers): None, normal Lower (legs, knees, ankles, toes): None, normal, Trunk Movements Neck, shoulders, hips: None, normal, Overall Severity Severity of abnormal movements (highest score from questions above): None, normal Incapacitation due to abnormal movements: None, normal Patient's awareness of abnormal movements (rate only patient's report): No Awareness, Dental Status Current problems with teeth and/or dentures?: No Does patient usually wear dentures?: No  CIWA:    COWS:     Assessment: Pt continues to take Prozac, but continues to decline any other treatment options. He is focussed on DC.   Treatment Plan Summary: Daily contact with patient to assess and evaluate symptoms and progress in treatment and Medication management  Will contiue Prozac 20 mg po daily for affective sx. Pt provided with medication education. Continue Trazodone 50 mg po qhs prn for sleep. Provided smoking cessation counseling . Will continue to monitor vitals ,medication compliance and treatment side effects while patient is here.  Will monitor for medical issues as well as call consult as needed.  Reviewed labs  TSH ordered was not done - pt refused. CSW will start working on disposition.  Patient to participate in therapeutic milieu .    Medical Decision Making:  Review of Psycho-Social Stressors (1), Review or order clinical lab tests (1) and Review of Medication Regimen & Side Effects (2)  Junelle Hashemi,Sarammamd 07/08/2015, 2:35 PM

## 2015-07-08 NOTE — Progress Notes (Signed)
Adult Psychoeducational Group Note  Date:  07/08/2015 Time:  9:48 PM  Group Topic/Focus:  Wrap-Up Group:   The focus of this group is to help patients review their daily goal of treatment and discuss progress on daily workbooks.  Participation Level:  Active  Participation Quality:  Appropriate  Affect:  Appropriate  Cognitive:  Appropriate  Insight: Appropriate  Engagement in Group:  Engaged  Modes of Intervention:  Discussion  Additional Comments: The patient expressed that he attended music therapy.  Gregory Graham 07/08/2015, 9:48 PM

## 2015-07-08 NOTE — [facility] Group Notes (Deleted)
[facility] Group Notes:  (Nursing/MHT/Case Management/Adjunct)  Date:  07/08/2015  Time:  11:55 AM  Type of Therapy:  Psychoeducational Skills  Participation Level:  Did Not Attend  Participation Quality:  Did Not Attend  Affect:  Did Not Attend  Cognitive:  Did Not Attend  Insight:  None  Engagement in Group:  Did Not Attend  Modes of Intervention:  Did Not Attend  Summary of Progress/Problems: Pt did not attend patient self inventory group.   Jacquelyne Balint Shanta 07/08/2015, 11:55 AM

## 2015-07-08 NOTE — [facility] Group Notes (Signed)
[facility] Group Notes:  (Nursing/MHT/Case Management/Adjunct)  Date:  07/08/2015  Time:  12:10 PM  Type of Therapy:  Psychoeducational Skills  Participation Level:  Minimal  Participation Quality:  Resistant  Affect:  Resistant  Cognitive:  Lacking  Insight:  Lacking  Engagement in Group:  Lacking  Modes of Intervention:  Problem-solving  Summary of Progress/Problems: Pt attended patient self inventory group, pt reported that his depression was a 0 and her hopelessness was a 0.    Jacquelyne Balint Shanta 07/08/2015, 12:10 PM

## 2015-07-08 NOTE — Progress Notes (Signed)
D: atient isolated himself in room during the shift. Seen pacing the room. Minimal interaction. Denies pain, SI,AH/VH at this time. No aggressive behavior noted. A: Patient was encouraged to continue with the treatment plan and verbalize needs to staff. Every 15 minutes check for safety maintained. Will continue to monitor patient for safety and stability. R: Patient remains isolated.

## 2015-07-08 NOTE — Progress Notes (Signed)
Patient ID: Gregory Graham, male   DOB: April 25, 1986, 29 y.o.   MRN: [id number]  DAR: Pt. Denies SI/HI and A/V Hallucinations. Patient does not report any pain or discomfort at this time. Support and encouragement provided to the patient. Scheduled medications administered to patient per physician's orders at the window however after giving patient the medication he walked away from the window. Patient was informed that he was to take the medication at the window. Another RN stopped patient and saw patient took Prozac. The KDUR was returned and disposed of medication.  Patient remains somewhat minimal. In the afternoon writer was handing another patient some food and [name] grabbed the door and appeared to be trying to walk into the galley area. Writer told him to step back from the door. Patient was compliant and retreated back to his room. Q15 minute checks are maintained for safety.

## 2015-07-09 DIAGNOSIS — F332 Major depressive disorder, recurrent severe without psychotic features: Principal | ICD-10-CM

## 2015-07-09 NOTE — Plan of Care (Signed)
Problem: Alteration in thought process Goal: STG-Patient is able to follow short directions Outcome: Not Progressing Patient does not follow directions as it pertains to his medications well. He does not take his medications from this writer and is resistant to care. Will line up for lunch and come to the dayroom when it is snack time though.

## 2015-07-09 NOTE — Progress Notes (Signed)
D: Patient alert and oriented x 4. Patient denies pain, SI/HI/AVH. Patient was very brief with this Clinical research associate and giving short one word answers. Patient was participating in a card game with other patients in day room.  A: Staff to monitor Q 15 mins for safety. Encouragement and support offered. Scheduled medications administered per orders. R: Patient remains safe on the unit. Patient attended group tonight. Patient visible on the unit and interacting with peers. Patient taking administered medications.

## 2015-07-09 NOTE — [facility] Group Notes (Signed)
[facility] LCSW Group Therapy  07/09/2015 1:15 pm  Type of Therapy: Process Group Therapy  Participation Level:  Active  Participation Quality:  Appropriate  Affect:  Flat  Cognitive:  Oriented  Insight:  Improving  Engagement in Group:  Limited  Engagement in Therapy:  Limited  Modes of Intervention:  Activity, Clarification, Education, Problem-solving and Support  Summary of Progress/Problems: Today's group addressed the issue of overcoming obstacles.  Patients were asked to identify their biggest obstacle post d/c that stands in the way of their on-going success, and then problem solve as to how to manage this.  Sat quietly for the entire group.  Contributed nothing spontaneously.  When asked about obstacles, he said he didn't have anything to say about the subject.  When asked if this was because he had none, or so many that he could not choose which one, he replied it was the latter.  Declined to elaborate.    Ida Rogue 07/09/2015   3:40 PM

## 2015-07-09 NOTE — Progress Notes (Signed)
Patient ID: Gregory Graham, male   DOB: [birth year], 29 y.o.   MRN: [id number]  DAR: Pt. Denies SI/HI and A/V Hallucinations. Patient does not report any pain or discomfort at this time. Support and encouragement provided to the patient. Scheduled medications are not being taken by patient at this time. When asked why he does not want to take his medication he just laughs and stated, "nah I'm good." Patient remains isolative and stays mainly in his room but is willing to go to cafeteria for meals and to the dayroom for snacks. Q15 minute checks are maintained for safety.

## 2015-07-09 NOTE — Progress Notes (Signed)
Patient ID: Gregory Graham, male   DOB: November 06, 1986, 29 y.o.   MRN: [id number] [facility] MD Progress Note  07/09/2015 1:58 PM Gregory Graham  MRN:  [id number] Subjective:    Patient reports he is feeling "OK" and denies any current depression/depressive symptoms. He states he is hoping for discharge soon. He is tolerating medications well and denies medication side effects at present . He is on Prozac. We reviewed medication side effects, to include potential for sexual dysfunction.  Patient minimizes events prior to admission and states " I was just kind of upset about something my family said, but I am not upset any more "  Objective:  Patient seen and chart notes reviewed. Patient has history of depression, anxiety, and a recent psychiatric admission. At this time denies sadness , denies depression and does not endorse any signficant neuro vegetative symptoms of depression. As per note, patient has tended to keep to self, and to appear somewhat guarded. Patient does not endorse any hallucinations, does not appear internally preoccupied, and behavior on unit has not been agitated or disruptive , although report is that patient was recently trying to open a door, but stopped when redirected by staff.  At present his major focus is to be discharged soon.  States" really, I'm doing fine, I don't need to be here ".  Pt currently on Prozac - denies side effects, and states he does feel it may be helping him.      Principal Problem: MDD (major depressive disorder), recurrent severe, without psychosis Diagnosis:   Patient Active Problem List   Diagnosis Date Noted  . MDD (major depressive disorder), recurrent severe, without psychosis [F33.2] 07/06/2015  . GAD (generalized anxiety disorder) [F41.1] 07/06/2015  . Cannabis use disorder, moderate, in sustained remission [F12.90] 07/06/2015  . Tobacco use disorder, moderate, dependence [F17.200] 07/06/2015   Total Time spent with patient: 20  minutes  Past Medical History:  Past Medical History  Diagnosis Date  . MDD (major depressive disorder), single episode, severe with psychotic features 05/19/2015   History reviewed. No pertinent past surgical history. Family History:  Family History  Problem Relation Age of Onset  . Mental illness Neg Hx    Social History:  History  Alcohol Use  . 1.2 oz/week  . 2 Cans of beer per week     History  Drug Use No    Social History   Social History  . Marital Status: Married    Spouse Name: N/A  . Number of Children: N/A  . Years of Education: N/A   Social History Main Topics  . Smoking status: Current Every Day Smoker -- 1.00 packs/day    Types: Cigarettes  . Smokeless tobacco: None  . Alcohol Use: 1.2 oz/week    2 Cans of beer per week  . Drug Use: No  . Sexual Activity: Yes     Comment: married   Other Topics Concern  . None   Social History Narrative   Additional History:    Sleep:  Improved   Appetite:   Improved      Musculoskeletal: Strength & Muscle Tone: within normal limits Gait & Station: normal Patient leans: N/A  Psychiatric Specialty Exam: Physical Exam  Psychiatric: His speech is delayed. He is withdrawn. Thought content is paranoid. Cognition and memory are normal. He exhibits a depressed mood.    Review of Systems  Constitutional: Negative for fever, chills, weight loss and malaise/fatigue.  HENT: Negative for ear pain, hearing loss and tinnitus.  Eyes: Negative for blurred vision, double vision, photophobia, pain and discharge.  Respiratory: Negative for cough, hemoptysis, sputum production and shortness of breath.   Cardiovascular: Negative for chest pain, palpitations, orthopnea, claudication and leg swelling.  Gastrointestinal: Negative for heartburn, nausea, vomiting, abdominal pain, diarrhea and constipation.  Genitourinary: Negative for dysuria, urgency, frequency and hematuria.  Musculoskeletal: Negative for myalgias, back  pain, joint pain and neck pain.  Skin: Negative for itching and rash.  Neurological: Negative for dizziness, tingling, tremors, sensory change, speech change and headaches.  Endo/Heme/Allergies: Negative for environmental allergies and polydipsia. Does not bruise/bleed easily.  Psychiatric/Behavioral: Positive for depression. Negative for suicidal ideas and hallucinations. The patient is nervous/anxious. The patient does not have insomnia.   All other systems reviewed and are negative.   Blood pressure 130/74, pulse 84, temperature 98.2 F (36.8 C), temperature source Oral, resp. rate 18, height  (1.727 m), weight 150 lb (68.04 kg), SpO2 100 %.Body mass index is 22.81 kg/(m^2).  General Appearance: Casual  Eye Contact::  Good  Speech:  Normal Rate  Volume:  Normal  Mood:   At this time denies depression or sadness   Affect:   Reactive, slightly /subtly guarded, but smiles appropriately at times . Not irritable .  Thought Process:  Coherent/ linear   Orientation:  Full (Time, Place, and Person)  Thought Content:  Denies hallucinations , does not appear internally preoccupied, no delusions are expressed, does present vaguely guarded   Suicidal Thoughts:  No denies suicidal ideations , denies self injurious ideations  Homicidal Thoughts:  Nodenies any thoughts of hurting anyone else   Memory:  Immediate;   Fair Recent;   Fair Remote;   Fair  Judgement:  Fair  Insight:  Fair  Psychomotor Activity:   Normal  Concentration:  Good  Recall:  Good  Fund of Knowledge:Good  Language: Good  Akathisia:  No  Handed:  Right  AIMS (if indicated):     Assets:  Physical Health Social Support Vocational/Educational  ADL's:  Intact  Cognition: WNL  Sleep:  Number of Hours: 6   Current Medications: Current Facility-Administered Medications  Medication Dose Route Frequency Provider Last Rate Last Dose  . acetaminophen (TYLENOL) tablet 650 mg  650 mg Oral Q6H PRN Adonis Brook, NP      .  alum & mag hydroxide-simeth (MAALOX/MYLANTA) 200-200-20 MG/5ML suspension 30 mL  30 mL Oral Q4H PRN Adonis Brook, NP      . FLUoxetine (PROZAC) capsule 20 mg  20 mg Oral Daily Jomarie Longs, MD   20 mg at 07/08/15 5284  . magnesium hydroxide (MILK OF MAGNESIA) suspension 30 mL  30 mL Oral Daily PRN Adonis Brook, NP      . potassium chloride SA (K-DUR,KLOR-CON) CR tablet 20 mEq  20 mEq Oral BID Jomarie Longs, MD   20 mEq at 07/08/15 0821  . traZODone (DESYREL) tablet 50 mg  50 mg Oral QHS PRN Adonis Brook, NP        Lab Results: No results found for this or any previous visit (from the past 48 hour(s)).  Physical Findings: AIMS: Facial and Oral Movements Muscles of Facial Expression: None, normal Lips and Perioral Area: None, normal Jaw: None, normal Tongue: None, normal,Extremity Movements Upper (arms, wrists, hands, fingers): None, normal Lower (legs, knees, ankles, toes): None, normal, Trunk Movements Neck, shoulders, hips: None, normal, Overall Severity Severity of abnormal movements (highest score from questions above): None, normal Incapacitation due to abnormal movements: None, normal Patient's awareness of  abnormal movements (rate only patient's report): No Awareness, Dental Status Current problems with teeth and/or dentures?: No Does patient usually wear dentures?: No  CIWA:    COWS:     Assessment:  Patient is reporting improvement, currently denying depression, denying significant anxiety. He is denying psychotic symptoms, but chart notes indicate a tendency towards being vaguely paranoid and guarded . Tolerating Prozac well.  At this time his major focus is to be discharged soon. Insight into events that led to admission seems limited .   Treatment Plan Summary: Daily contact with patient to assess and evaluate symptoms and progress in treatment and Medication management  Continue  Prozac 20 mg po daily for depression and for  anxiety  . Medication side effects  reviewed Continue Trazodone 50 mg po qhs prn for sleep. Continue K Dur for management of hypokalemia . Will reorder BMP in AM to reassess electrolytes . Will continue to monitor vitals ,medication compliance and treatment side effects while patient is here.  Monitor for potential psychotic symptoms Encourage milieu participation. Dr. Elna Breslow will reassess in AM- patient focused on being discharged soon. .    Medical Decision Making:  Review of Psycho-Social Stressors (1), Review or order clinical lab tests (1) and Review of Medication Regimen & Side Effects (2)  COBOS, FERNANDO , MD  07/09/2015, 1:58 PM

## 2015-07-10 MED ORDER — FLUOXETINE HCL 20 MG PO CAPS: 20.0000 mg | ORAL_CAPSULE | Freq: Every day | ORAL | Status: AC

## 2015-07-10 MED ORDER — TRAZODONE HCL 50 MG PO TABS
50.0000 mg | ORAL_TABLET | Freq: Every evening | ORAL | Status: AC | PRN
Start: 2015-07-10 — End: ?

## 2015-07-10 NOTE — Tx Team (Signed)
Interdisciplinary Treatment Plan Update (Adult)  Date:  07/10/2015   Time Reviewed:  9:03 AM   Progress in Treatment: Attending groups: Yes. Participating in groups:  Minimally Taking medication as prescribed:  Yes. Tolerating medication:  Yes. Family/Significant othe contact made:  Yes  Wife Patient understands diagnosis:  No  Limited insight Discussing patient identified problems/goals with staff:  Yes, see initial care plan. Medical problems stabilized or resolved:  Yes. Denies suicidal/homicidal ideation: Yes. Issues/concerns per patient self-inventory:  No. Other:  New problem(s) identified:  Discharge Plan or Barriers: return home, follow up outpt  Reason for Continuation of Hospitalization:   Comments:  CSW spoke with pt's wife, Kiven Vangilder [phone]. Wife states pt first began exhibiting seemingly paranoid behavior following his d/c from [facility] in 05/17/2015. She states that about 2 weeks after d/c he left his home, where she and his children live, "and I did not hear from him for so long that I filled out a missing persons report. I found out he had gone to stay with his aunt and cousins, and he told me he just needed some time away from home to get himself together. However, his family told me he had been telling them he had to leave home because the kids and I were trying to kill him."   Prozac trial  Estimated length of stay: 4-5 days  New goal(s):  Review of initial/current patient goals per problem list:   Review of initial/current patient goals per problem list:  1. Goal(s): Patient will participate in aftercare plan   Met: Yes   Target date: 3-5 days post admission date   As evidenced by: Patient will participate within aftercare plan AEB aftercare provider and housing plan at discharge being identified.  07/10/2015:Pt plans to return home, states he will not follow up outpt        3. Goal(s): Patient will demonstrate decreased signs and symptoms of  anxiety.   Met: Yes   Target date: 3-5 days post admission date   As evidenced by: Patient will utilize self rating of anxiety at 3 or below and demonstrated decreased signs of anxiety, or be deemed stable for discharge by MD 07/06/15  Rates his depression a 5 today 07/10/2015 Denies anxiety today.         5. Goal(s): Patient will demonstrate decreased signs of psychosis  * Met: Yes  * Target date: 3-5 days post admission date  * As evidenced by: Patient will demonstrate decreased frequency of AVH or return to baseline function 07/06/15 Pt presents with paranoia.  Believes there is nothing wrong with him.  Unable to work due to paranoia. Refusing meds to address this symptom 07/10/2015 No signs nor symptoms of psychosis today.           Attendees: Patient:  07/10/2015 9:03 AM   Family:   07/10/2015 9:03 AM   Physician:  Ursula Alert, MD 07/10/2015 9:03 AM   Nursing:   Manuella Ghazi, RN 07/10/2015 9:03 AM   CSW:    Roque Lias, LCSW   07/10/2015 9:03 AM   Other:  07/10/2015 9:03 AM   Other:   07/10/2015 9:03 AM   Other:  Lars Pinks, Nurse CM 07/10/2015 9:03 AM   Other:  Lucinda Dell, [facility] 07/10/2015 9:03 AM   Other:  Norberto Sorenson, [place]  07/10/2015 9:03 AM   Other:  07/10/2015 9:03 AM   Other:  07/10/2015 9:03 AM   Other:  07/10/2015 9:03 AM   Other:  07/10/2015  9:03 AM   Other:  07/10/2015 9:03 AM   Other:   07/10/2015 9:03 AM    Scribe for Treatment Team:   Trish Mage, 07/10/2015 9:03 AM

## 2015-07-10 NOTE — [facility] Suicide Risk Assessment (Signed)
[facility] Discharge Suicide Risk Assessment   Demographic Factors:  Male  Total Time spent with patient: 30 minutes  Musculoskeletal: Strength & Muscle Tone: within normal limits Gait & Station: normal Patient leans: N/A  Psychiatric Specialty Exam: Physical Exam  Review of Systems  Psychiatric/Behavioral: Negative for depression, suicidal ideas and substance abuse.  All other systems reviewed and are negative.   Blood pressure 113/79, pulse 104, temperature 98.5 F (36.9 C), temperature source Oral, resp. rate 16, height  (1.727 m), weight 68.04 kg (150 lb), SpO2 100 %.Body mass index is 22.81 kg/(m^2).  General Appearance: Casual  Eye Contact::  Fair  Speech:  Clear and Coherent409  Volume:  Normal  Mood:  Euthymic  Affect:  Constricted  Thought Process:  Coherent  Orientation:  Full (Time, Place, and Person)  Thought Content:  WDL  Suicidal Thoughts:  No  Homicidal Thoughts:  No  Memory:  Immediate;   Fair Recent;   Fair Remote;   Fair  Judgement:  Fair  Insight:  Fair  Psychomotor Activity:  Normal  Concentration:  Fair  Recall:  [employer] of Knowledge:Fair  Language: Fair  Akathisia:  No  Handed:  Right  AIMS (if indicated):     Assets:  Communication Skills Desire for Improvement  Sleep:  Number of Hours: 6  Cognition: WNL  ADL's:  Intact   Have you used any form of tobacco in the last 30 days? (Cigarettes, Smokeless Tobacco, Cigars, and/or Pipes): No  Has this patient used any form of tobacco in the last 30 days? (Cigarettes, Smokeless Tobacco, Cigars, and/or Pipes) No  Mental Status Per Nursing Assessment::   On Admission:  NA  Current Mental Status by Physician: NA  Loss Factors: Decrease in vocational status and Financial problems/change in socioeconomic status  Historical Factors: Impulsivity  Risk Reduction Factors:   Positive social support  Continued Clinical Symptoms:  Previous Psychiatric Diagnoses and Treatments  Cognitive  Features That Contribute To Risk:  None    Suicide Risk:  Minimal: No identifiable suicidal ideation.  Patients presenting with no risk factors but with morbid ruminations; may be classified as minimal risk based on the severity of the depressive symptoms  Principal Problem: MDD (major depressive disorder), recurrent severe, without psychosis Discharge Diagnoses:  Patient Active Problem List   Diagnosis Date Noted  . MDD (major depressive disorder), recurrent severe, without psychosis [F33.2] 07/06/2015  . GAD (generalized anxiety disorder) [F41.1] 07/06/2015  . Cannabis use disorder, moderate, in sustained remission [F12.90] 07/06/2015  . Tobacco use disorder, moderate, dependence [F17.200] 07/06/2015    Follow-up Information    Follow up with [facility].   Why:  Go to the walk-in clinic M-F between 8 and 11 AM for your hospital follow up appointment   Contact information:   [address]  [place] [[phone]] [phone]      Plan Of Care/Follow-up recommendations:  Activity:  no restrictions Diet:  regular Tests:  as needed Other:  follow up with after care  Is patient on multiple antipsychotic therapies at discharge:  No   Has Patient had three or more failed trials of antipsychotic monotherapy by history:  No  Recommended Plan for Multiple Antipsychotic Therapies: NA    Magan Winnett md 07/10/2015, 9:37 AM

## 2015-07-10 NOTE — Progress Notes (Signed)
Pt. D/C from the unit to lobby with bus pass.  Pleasant and cooperative. Voiced no SI/HI or A/V halluciations.  Pt. denies any pain or discomfort.  D/C instructions and medications reviewed with pt.  Pt. verbalized understanding of medications and d/c instructions.  Pt had no belongings in the locker. Q 15 min checks maintained until discharge.  Pt. Left the unit in no apparent distress.

## 2015-07-10 NOTE — Discharge Summary (Signed)
Physician Discharge Summary Note  Patient:  Gregory Graham is an 29 y.o., male MRN:  [id number] DOB:  08/07/1986 Patient phone:  [phone] (home)  Patient address:   [address] [place] [address],  Total Time spent with patient: 30 minutes  Date of Admission:  07/05/2015 Date of Discharge: 07/10/2015  Reason for Admission:    Principal Problem: MDD (major depressive disorder), recurrent severe, without psychosis Discharge Diagnoses: Patient Active Problem List   Diagnosis Date Noted  . MDD (major depressive disorder), recurrent severe, without psychosis [F33.2] 07/06/2015  . GAD (generalized anxiety disorder) [F41.1] 07/06/2015  . Cannabis use disorder, moderate, in sustained remission [F12.90] 07/06/2015  . Tobacco use disorder, moderate, dependence [F17.200] 07/06/2015    Musculoskeletal: Strength & Muscle Tone: within normal limits Gait & Station: normal Patient leans: N/A  Psychiatric Specialty Exam: Physical Exam  Psychiatric: He has a normal mood and affect. His speech is normal and behavior is normal. Judgment and thought content normal. Cognition and memory are normal.    Review of Systems  Constitutional: Negative.   HENT: Negative.   Eyes: Negative.   Respiratory: Negative.   Cardiovascular: Negative.   Gastrointestinal: Negative.   Genitourinary: Negative.   Musculoskeletal: Negative.   Skin: Negative.   Neurological: Negative.   Endo/Heme/Allergies: Negative.   Psychiatric/Behavioral: Positive for depression (Stabilized ). Negative for suicidal ideas, hallucinations, memory loss and substance abuse. The patient is not nervous/anxious and does not have insomnia.     Blood pressure 113/79, pulse 104, temperature 98.5 F (36.9 C), temperature source Oral, resp. rate 16, height 5\' 8"  (1.727 m), weight 68.04 kg (150 lb), SpO2 100 %.Body mass index is 22.81 kg/(m^2).  See Physician SRA     Have you used any form of tobacco in the last 30 days?  (Cigarettes, Smokeless Tobacco, Cigars, and/or Pipes): No  Has this patient used any form of tobacco in the last 30 days? (Cigarettes, Smokeless Tobacco, Cigars, and/or Pipes) No  Past Medical History:  Past Medical History  Diagnosis Date  . MDD (major depressive disorder), single episode, severe with psychotic features 05/19/2015   History reviewed. No pertinent past surgical history. Family History:  Family History  Problem Relation Age of Onset  . Mental illness Neg Hx    Social History:  History  Alcohol Use  . 1.2 oz/week  . 2 Cans of beer per week     History  Drug Use No    Social History   Social History  . Marital Status: Married    Spouse Name: N/A  . Number of Children: N/A  . Years of Education: N/A   Social History Main Topics  . Smoking status: Current Every Day Smoker -- 1.00 packs/day    Types: Cigarettes  . Smokeless tobacco: None  . Alcohol Use: 1.2 oz/week    2 Cans of beer per week  . Drug Use: No  . Sexual Activity: Yes     Comment: married   Other Topics Concern  . None   Social History Narrative   Risk to Self: Is patient at risk for suicide?: No Risk to Others:   Prior Inpatient Therapy:   Prior Outpatient Therapy:    Level of Care:  OP  Hospital Course:    Gregory Graham is a 29 y.o. male who voluntarily presents to [facility] c/o of worsening depression and anxiety. Patient denied SI/HI/AVH. Patient reported stressors attributing to his depression: (1) unemployment; (2) financial; (3) being away from his wife and children.  Patient said his depression/anxiety has been getting worse for approx 33mos-84yr. Patient is not currently engaging in outpatient services and says he is not any psych meds. Patient describes depressive sxs: anhedonia, poor appetite/sleep, loneliness, helplessness and hopelessness. Patient stated his wife brought him to the hospital and she provided collateral info to the medical staff: This note is on what the  patient's wife has has been hearing and seeing from the patient: Per the patient's wife on Tuesday the pt was outside yelling and the cops were called. When the cops arrived the patient was calm and cooperative. When the patient was on the phone with his wife the patient stated "when I get home I am going to kill them too" and "the evil things are going to take me over". The patient's wife does not believe that the patient is remembering everything that he is going and saying. When the patient's wife asks the patient what he has said or who he is talking to and the patient has been saying, "I didn't say anything, you are hearing things". On Monday around 0300 the pt walked from [facility] to [place], this is unusual for the patient. Since about one week after the patient was discharged from [facility] the pt has been talking about "an audio" in the car outside, making him want to move the car further down the street so that he can not hear it. The patient has also been feeling like everyone around him is talking about him and his wife.          Gregory Graham was admitted to the adult 500 unit. He was evaluated and his symptoms were identified. Medication management was discussed and initiated. The patient was not documented as taking any psychotropic medications prior to admission. He was started on Prozac 20 mg daily for depression. He received Trazodone 50 mg hs prn for insomnia. He was oriented to the unit and encouraged to participate in unit programming. Medical problems were identified and treated appropriately. Home medication was restarted as needed.        The patient was evaluated each day by a clinical provider to ascertain the patient's response to treatment. Patient was noted during follow up assessments to greatly minimize the events that resulted in his admission. The patient denied symptoms of depression and suicidal thoughts. It was documented in notes by Dr. Jama Graham that he  was fixated on discharge. Improvement was noted by the patient's report of decreasing symptoms, improved sleep and appetite, affect, medication tolerance, behavior, and participation in unit programming.  He was asked each day to complete a self inventory noting mood, mental status, pain, new symptoms, anxiety and concerns. The social worker contacted patient's wife for collateral information prior to discharge. The wife indicated the patient had experienced some paranoia after his last discharge from [facility] and had limited insight into his depression. Patient tolerated his Prozac trial with no reported adverse reactions.          He responded well to medication and being in a therapeutic and supportive environment. Positive and appropriate behavior was noted and the patient was motivated for recovery.  The patient worked closely with the treatment team and case manager to develop a discharge plan with appropriate goals. Coping skills, problem solving as well as relaxation therapies were also part of the unit programming.         By the day of discharge he was in much improved condition than upon admission.  Symptoms were reported  as significantly decreased or resolved completely. The patient denied SI/HI and voiced no AVH. He was motivated to continue taking medication with a goal of continued improvement in mental health.  Gregory Graham was discharged home with a plan to follow up as noted below. The patient was provided with sample medications and prescriptions at time of discharge. He left [facility] in stable condition with all belongings returned to him.    Consults:  psychiatry  Significant Diagnostic Studies:  Chemistry profile, CBC, UDS negative,   Discharge Vitals:   Blood pressure 113/79, pulse 104, temperature 98.5 F (36.9 C), temperature source Oral, resp. rate 16, height 5\' 8"  (1.727 m), weight 68.04 kg (150 lb), SpO2 100 %. Body mass index is 22.81 kg/(m^2). Lab Results:   No results found  for this or any previous visit (from the past 72 hour(s)).  Physical Findings: AIMS: Facial and Oral Movements Muscles of Facial Expression: None, normal Lips and Perioral Area: None, normal Jaw: None, normal Tongue: None, normal,Extremity Movements Upper (arms, wrists, hands, fingers): None, normal Lower (legs, knees, ankles, toes): None, normal, Trunk Movements Neck, shoulders, hips: None, normal, Overall Severity Severity of abnormal movements (highest score from questions above): None, normal Incapacitation due to abnormal movements: None, normal Patient's awareness of abnormal movements (rate only patient's report): No Awareness, Dental Status Current problems with teeth and/or dentures?: No Does patient usually wear dentures?: No  CIWA:    COWS:      See Psychiatric Specialty Exam and Suicide Risk Assessment completed by Attending Physician prior to discharge.  Discharge destination:  Home  Is patient on multiple antipsychotic therapies at discharge:  No   Has Patient had three or more failed trials of antipsychotic monotherapy by history:  No  Recommended Plan for Multiple Antipsychotic Therapies: NA     Medication List    TAKE these medications      Indication   FLUoxetine 20 MG capsule  Commonly known as:  PROZAC  Take 1 capsule (20 mg total) by mouth daily.   Indication:  Depression     traZODone 50 MG tablet  Commonly known as:  DESYREL  Take 1 tablet (50 mg total) by mouth at bedtime as needed for sleep.   Indication:  Trouble Sleeping       Follow-up Information    Follow up with [facility].   Why:  Go to the walk-in clinic M-F between 8 and 11 AM for your hospital follow up appointment   Contact information:   [address]  [place] [336] [phone]      Follow-up recommendations:    Activity: no restrictions Diet: regular Tests: as needed Other: follow up with after care  Comments:   Take all your medications as prescribed by your  mental healthcare provider.  Report any adverse effects and or reactions from your medicines to your outpatient provider promptly.  Patient is instructed and cautioned to not engage in alcohol and or illegal drug use while on prescription medicines.  In the event of worsening symptoms, patient is instructed to call the crisis hotline, 911 and or go to the nearest ED for appropriate evaluation and treatment of symptoms.  Follow-up with your primary care provider for your other medical issues, concerns and or health care needs.   Total Discharge Time: Greater than 30 minutes  Signed: Fransisca Kaufmann, NP-C 07/10/2015, 4:43 PM

## 2015-07-10 NOTE — Progress Notes (Signed)
  [facility] Adult Case Management Discharge Plan :  Will you be returning to the same living situation after discharge:  Yes,  home At discharge, do you have transportation home?: Yes,  wife Do you have the ability to pay for your medications: Yes,  not taking any  Release of information consent forms completed and in the chart;  Patient's signature needed at discharge.  Patient to Follow up at: Follow-up Information    Follow up with [facility].   Why:  Go to the walk-in clinic M-F between 8 and 11 AM for your hospital follow up appointment   Contact information:   [address]  [place] [336] [phone]      Patient denies SI/HI: Yes,  yes    Safety Planning and Suicide Prevention discussed: Yes,  yes  Have you used any form of tobacco in the last 30 days? (Cigarettes, Smokeless Tobacco, Cigars, and/or Pipes): No  Has patient been referred to the Quitline?: N/A patient is not a smoker  Gregory Graham 07/10/2015, 9:13 AM

## 2015-07-10 NOTE — [facility] Group Notes (Signed)
[facility] Group Notes:  (Nursing/MHT/Case Management/Adjunct)  Date:  07/10/2015  Time:  12:18 PM  Type of Therapy:  Group Therapy  Participation Level:  Minimal  Participation Quality:  Attentive  Affect:  Flat  Cognitive:  Alert  Insight:  Limited  Engagement in Group:  None  Modes of Intervention:  Discussion and Education  Summary of Progress/Problems:  Purpose of the group was recovery and goals. Discussed the importance of setting appropriate and measurable goals. Reviewed sleep hygiene. Gregory Graham was very quiet during the group.  He came willingly but did not participate.    [name] [name] 07/10/2015, 12:18 PM

## 2015-07-13 NOTE — Clinical Social Work Note (Signed)
Pt has care coordinator, Darnelle Maffucci, [phone].  [place], LCSW Clinical Social Worker

## 2016-02-25 IMAGING — DX DG CHEST 2V
2 series · 2 of 2 positions shown · non-contrast
Comparison: Chest radiograph dated 03/26/2013

CLINICAL DATA: 28-year-old male with chest pain.

EXAM:
CHEST  2 VIEW

[chest pa]
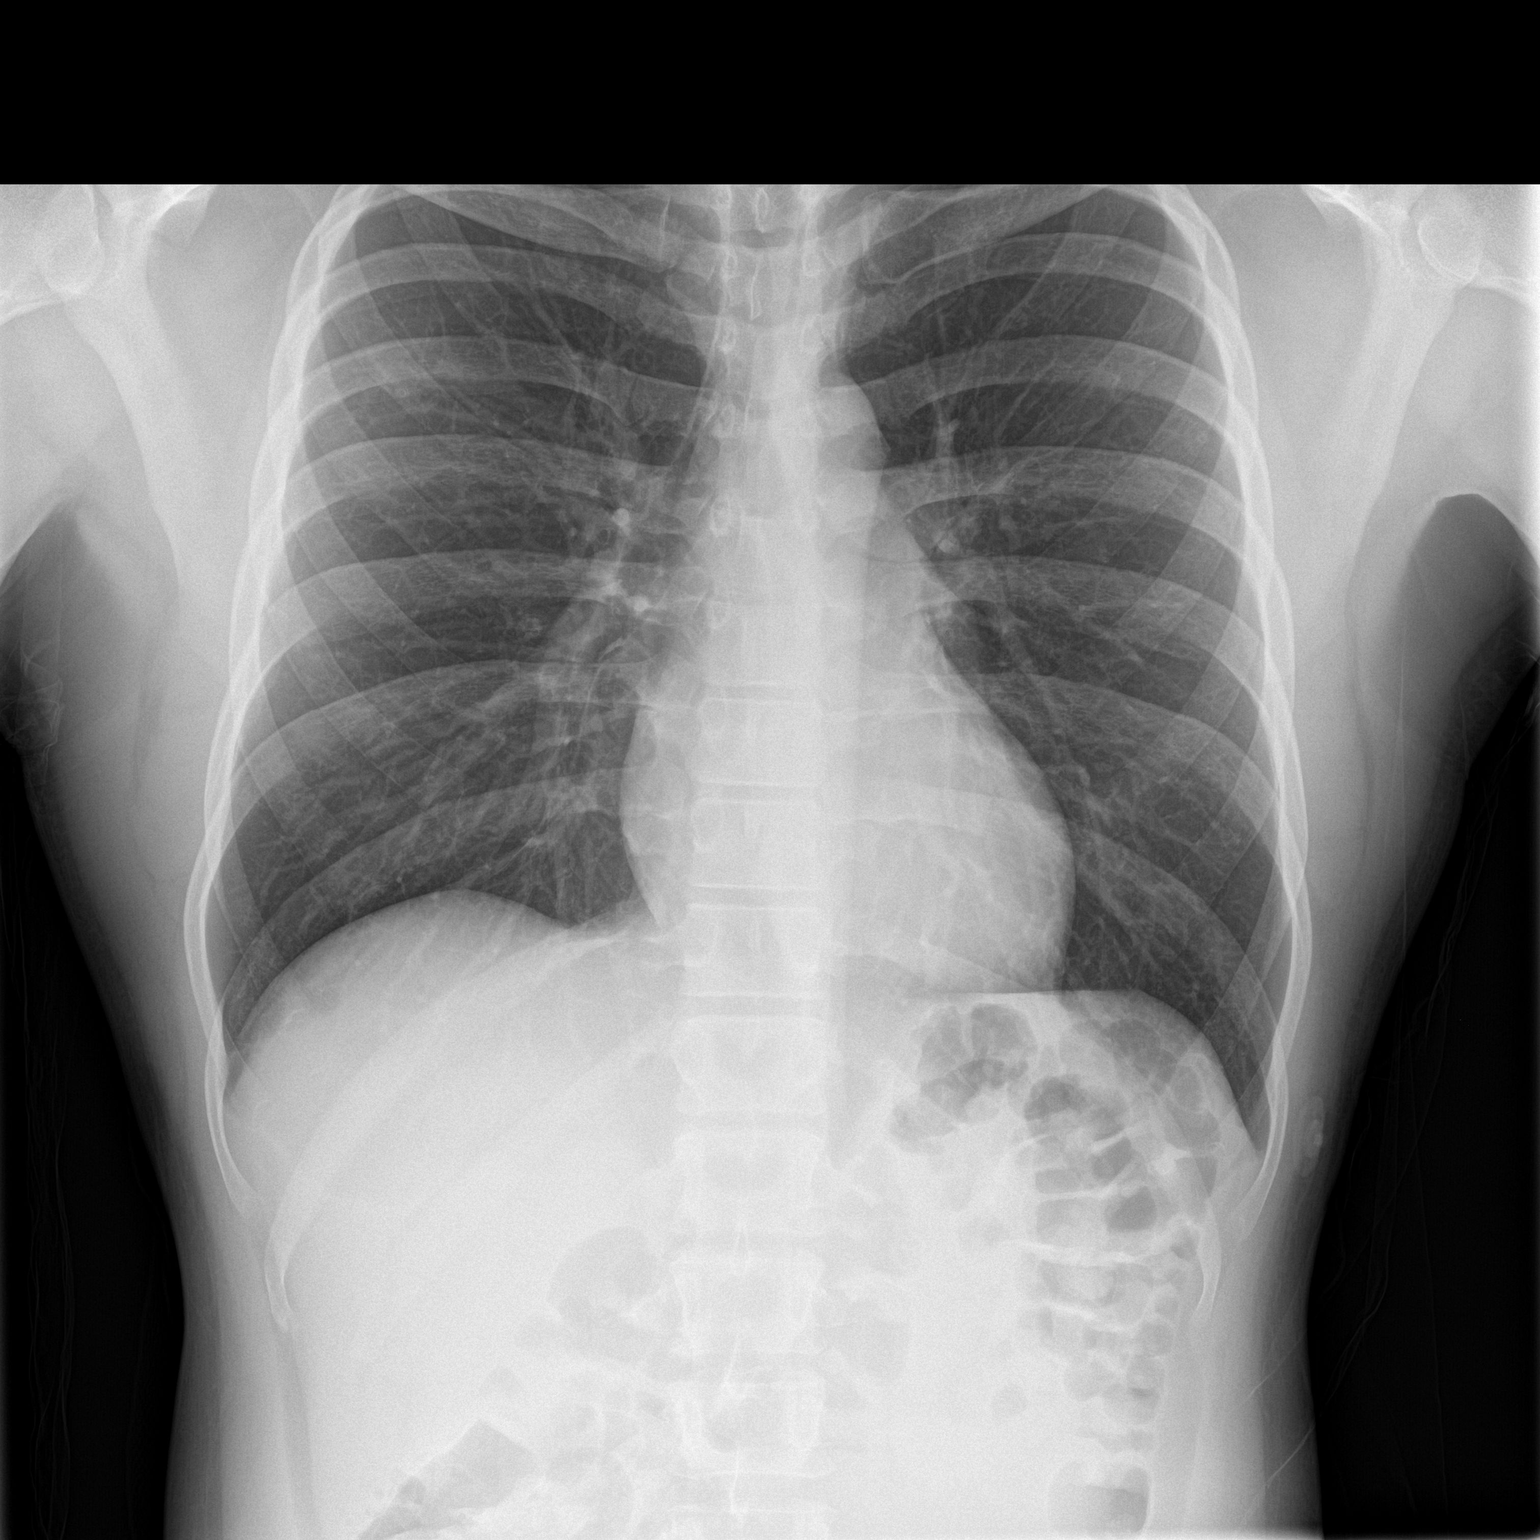

[chest lat]
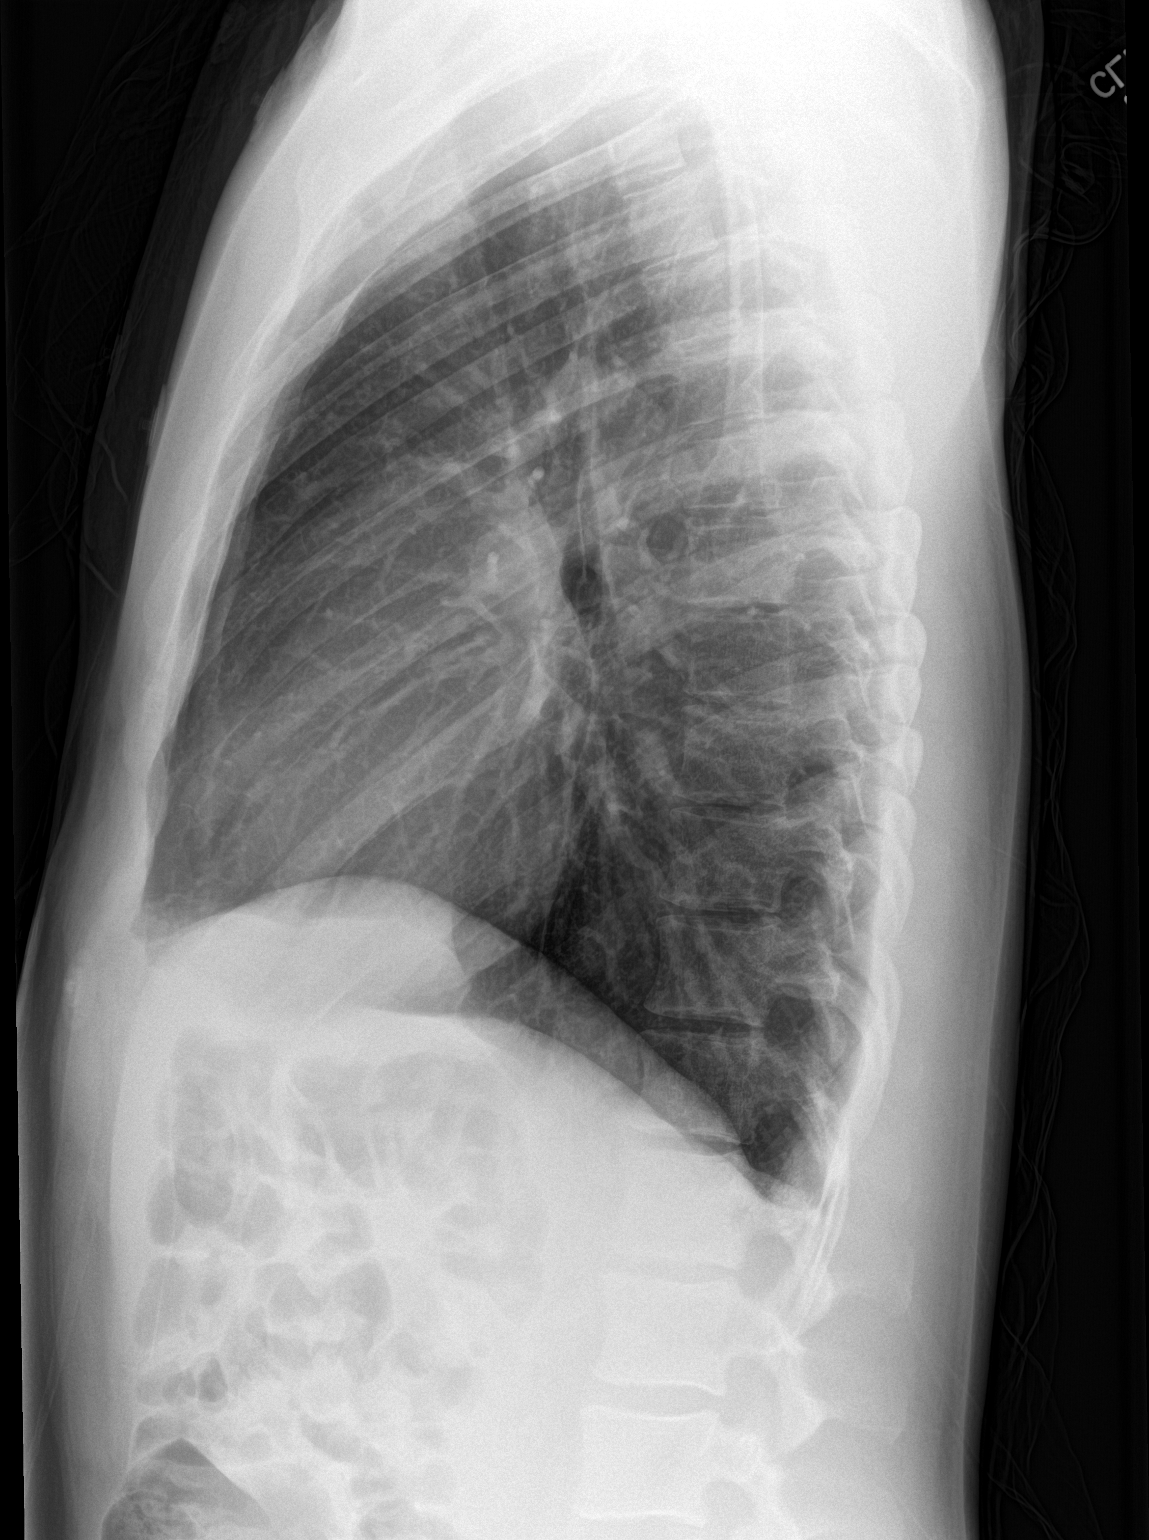

[2 of 2 positions shown; findings below may reference images not displayed]

FINDINGS: The heart size and mediastinal contours are within normal limits.
Both lungs are clear. The visualized skeletal structures are
unremarkable.
IMPRESSION: No active cardiopulmonary disease.
